# Patient Record
Sex: Female | Born: 1975 | Race: White | Hispanic: No | Marital: Married | Smoking: Never smoker
Health system: Southern US, Community
[De-identification: ages and names within clinical notes are randomized; demographics above are authoritative.]

## PROBLEM LIST (undated history)

## (undated) DIAGNOSIS — R3915 Urgency of urination: Secondary | ICD-10-CM

## (undated) DIAGNOSIS — R350 Frequency of micturition: Secondary | ICD-10-CM

## (undated) DIAGNOSIS — N3010 Interstitial cystitis (chronic) without hematuria: Secondary | ICD-10-CM

## (undated) HISTORY — PX: OTHER SURGICAL HISTORY: SHX169

## (undated) HISTORY — PX: APPENDECTOMY: SHX54

---

## 2009-11-27 HISTORY — PX: TOTAL ABDOMINAL HYSTERECTOMY W/ BILATERAL SALPINGOOPHORECTOMY: SHX83

## 2015-09-17 ENCOUNTER — Emergency Department (HOSPITAL_COMMUNITY)
Admission: EM | Admit: 2015-09-17 | Discharge: 2015-09-17 | Disposition: A | Discharge status: Home / Self Care | Payer: Medicaid Other | Attending: Emergency Medicine | Admitting: Emergency Medicine

## 2015-09-17 ENCOUNTER — Encounter (HOSPITAL_COMMUNITY): Payer: Self-pay | Admitting: Emergency Medicine

## 2015-09-17 ENCOUNTER — Emergency Department (HOSPITAL_COMMUNITY): Payer: Medicaid Other

## 2015-09-17 DIAGNOSIS — R1084 Generalized abdominal pain: Secondary | ICD-10-CM | POA: Diagnosis not present

## 2015-09-17 DIAGNOSIS — Z9089 Acquired absence of other organs: Secondary | ICD-10-CM | POA: Diagnosis not present

## 2015-09-17 DIAGNOSIS — R112 Nausea with vomiting, unspecified: Secondary | ICD-10-CM | POA: Insufficient documentation

## 2015-09-17 LAB — COMPREHENSIVE METABOLIC PANEL
ALBUMIN: 4.6 g/dL (ref 3.5–5.0)
ALK PHOS: 70 U/L (ref 38–126)
ALT: 21 U/L (ref 14–54)
ANION GAP: 8 (ref 5–15)
AST: 20 U/L (ref 15–41)
BUN: 11 mg/dL (ref 6–20)
CALCIUM: 9.5 mg/dL (ref 8.9–10.3)
CO2: 27 mmol/L (ref 22–32)
CREATININE: 0.68 mg/dL (ref 0.44–1.00)
Chloride: 104 mmol/L (ref 101–111)
GFR calc Af Amer: >60 mL/min (ref >60)
GFR calc non Af Amer: >60 mL/min (ref >60)
GLUCOSE: 98 mg/dL (ref 65–99)
Potassium: 4 mmol/L (ref 3.5–5.1)
SODIUM: 139 mmol/L (ref 135–145)
Total Bilirubin: 0.7 mg/dL (ref 0.3–1.2)
Total Protein: 8.3 g/dL — ABNORMAL HIGH (ref 6.5–8.1)

## 2015-09-17 LAB — URINALYSIS, ROUTINE W REFLEX MICROSCOPIC
BILIRUBIN URINE: NEGATIVE
Glucose, UA: NEGATIVE mg/dL
KETONES UR: NEGATIVE mg/dL
Leukocytes, UA: NEGATIVE
Nitrite: NEGATIVE
PH: 7 (ref 5.0–8.0)
Protein, ur: NEGATIVE mg/dL
Specific Gravity, Urine: 1.008 (ref 1.005–1.030)
Urobilinogen, UA: 0.2 mg/dL (ref 0.0–1.0)

## 2015-09-17 LAB — LIPASE, BLOOD: Lipase: 26 U/L (ref 11–51)

## 2015-09-17 LAB — CBC
HCT: 37.8 % (ref 36.0–46.0)
HEMOGLOBIN: 12.4 g/dL (ref 12.0–15.0)
MCH: 30.3 pg (ref 26.0–34.0)
MCHC: 32.8 g/dL (ref 30.0–36.0)
MCV: 92.4 fL (ref 78.0–100.0)
Platelets: 328 10*3/uL (ref 150–400)
RBC: 4.09 MIL/uL (ref 3.87–5.11)
RDW: 12.6 % (ref 11.5–15.5)
WBC: 6.7 10*3/uL (ref 4.0–10.5)

## 2015-09-17 LAB — URINE MICROSCOPIC-ADD ON

## 2015-09-17 MED ORDER — ONDANSETRON HCL 4 MG/2ML IJ SOLN
4.0000 mg | Freq: Once | INTRAMUSCULAR | Status: AC
  Administered 2015-09-17: 4 mg via INTRAVENOUS
  Filled 2015-09-17: qty 2

## 2015-09-17 MED ORDER — SODIUM CHLORIDE 0.9 % IV BOLUS (SEPSIS)
1000.0000 mL | Freq: Once | INTRAVENOUS | Status: AC
  Administered 2015-09-17: 1000 mL via INTRAVENOUS

## 2015-09-17 MED ORDER — PROMETHAZINE HCL 25 MG/ML IJ SOLN
25.0000 mg | Freq: Once | INTRAMUSCULAR | Status: AC
  Administered 2015-09-17: 25 mg via INTRAVENOUS
  Filled 2015-09-17: qty 1

## 2015-09-17 MED ORDER — DICYCLOMINE HCL 10 MG PO CAPS
10.0000 mg | ORAL_CAPSULE | Freq: Once | ORAL | Status: AC
  Administered 2015-09-17: 10 mg via ORAL
  Filled 2015-09-17: qty 1

## 2015-09-17 MED ORDER — ONDANSETRON 4 MG PO TBDP: ORAL_TABLET | ORAL | Status: AC

## 2015-09-17 MED ORDER — IOHEXOL 300 MG/ML  SOLN
100.0000 mL | Freq: Once | INTRAMUSCULAR | Status: AC | PRN
  Administered 2015-09-17: 100 mL via INTRAVENOUS

## 2015-09-17 MED ORDER — IOHEXOL 300 MG/ML  SOLN
25.0000 mL | INTRAMUSCULAR | Status: DC
  Administered 2015-09-17: 50 mL via ORAL

## 2015-09-17 MED ORDER — KETOROLAC TROMETHAMINE 30 MG/ML IJ SOLN
30.0000 mg | Freq: Once | INTRAMUSCULAR | Status: AC
  Administered 2015-09-17: 30 mg via INTRAVENOUS
  Filled 2015-09-17: qty 1

## 2015-09-17 MED ORDER — MORPHINE SULFATE (PF) 4 MG/ML IV SOLN
4.0000 mg | Freq: Once | INTRAVENOUS | Status: AC
  Administered 2015-09-17: 4 mg via INTRAVENOUS
  Filled 2015-09-17: qty 1

## 2015-09-17 MED ORDER — ONDANSETRON HCL 4 MG/2ML IJ SOLN
4.0000 mg | Freq: Once | INTRAMUSCULAR | Status: AC | PRN
  Administered 2015-09-17: 4 mg via INTRAVENOUS
  Filled 2015-09-17 (×2): qty 2

## 2015-09-17 NOTE — ED Provider Notes (Signed)
CSN: [id number]     Arrival date & time 09/17/15  4098 History   First MD Initiated Contact with Patient 09/17/15 0756     Chief Complaint  Patient presents with  . Abdominal Pain  . Emesis     (Consider location/radiation/quality/duration/timing/severity/associated sxs/prior Treatment) HPI Comments: 39 year old female complaining of gradually worsening abdominal pain, nausea and vomiting 4 days. Pain begins in her right upper quadrant radiating throughout her entire abdomen and as of 2 days ago towards her back. Pain is constant, described as sharp and "radiating" rated 7/10. No alleviating factors. Has no appetite and unable to tolerate any food or drink. Reports about 7 episodes of nonbloody, nonbilious emesis over the past 24 hours and a few episodes of intermittent, nonbloody diarrhea. She feels bloated. Denies any urinary symptoms. Reports chills without fever. Feels fatigued from vomiting and lack of oral intake. History of total abdominal hysterectomy and appendectomy.  Patient is a 39 y.o. female presenting with abdominal pain and vomiting. The history is provided by the patient.  Abdominal Pain Associated symptoms: chills, diarrhea, fatigue, nausea and vomiting   Emesis Associated symptoms: abdominal pain, chills and diarrhea     History reviewed. No pertinent past medical history. Past Surgical History  Procedure Laterality Date  . Abdominal hysterectomy    . Appendectomy     History reviewed. No pertinent family history. Social History  Substance Use Topics  . Smoking status: Never Smoker   . Smokeless tobacco: None  . Alcohol Use: No   OB History    No data available     Review of Systems  Constitutional: Positive for chills, appetite change and fatigue.  Gastrointestinal: Positive for nausea, vomiting, abdominal pain and diarrhea.  All other systems reviewed and are negative.     Allergies  Review of patient's allergies indicates no known  allergies.  Home Medications   Prior to Admission medications   Medication Sig Start Date End Date Taking? Authorizing Provider  bismuth subsalicylate (PEPTO BISMOL) 262 MG/15ML suspension Take 30 mLs by mouth every 6 (six) hours as needed for indigestion.   Yes Historical Provider, MD  Diphenhydramine-APAP, sleep, (EXCEDRIN PM PO) Take 1 tablet by mouth daily as needed.   Yes Historical Provider, MD  Phenyleph-Doxylamine-DM-APAP (NYQUIL SEVERE COLD/FLU) 5-6.25-10-325 MG/15ML LIQD Take 15 mLs by mouth 2 (two) times daily as needed (cough/congestion).   Yes Historical Provider, MD  ondansetron (ZOFRAN ODT) 4 MG disintegrating tablet  ODT q4 hours prn nausea/vomit 09/17/15   Kyrel Leighton M Kymere Fullington, PA-C   BP 102/63 mmHg  Pulse 67  Temp(Src) 97.8 F (36.6 C) (Oral)  Resp 18  Ht  (1.6 m)  Wt 165 lb (74.844 kg)  BMI 29.24 kg/m2  SpO2 100% Physical Exam  Constitutional: She is oriented to person, place, and time. She appears well-developed and well-nourished. No distress.  HENT:  Head: Normocephalic and atraumatic.  Mouth/Throat: Oropharynx is clear and moist.  Eyes: Conjunctivae and EOM are normal. Pupils are equal, round, and reactive to light. No scleral icterus.  Neck: Normal range of motion. Neck supple.  Cardiovascular: Normal rate, regular rhythm and normal heart sounds.   Pulmonary/Chest: Effort normal and breath sounds normal. No respiratory distress.  Abdominal: Soft. Normal appearance and bowel sounds are normal. She exhibits no distension. There is tenderness. There is positive Murphy's sign. There is no rigidity.  Generalized tenderness increased in RUQ/epigastrum to light touch with guarding.  Musculoskeletal: Normal range of motion. She exhibits no edema.  Neurological: She is  alert and oriented to person, place, and time. No sensory deficit.  Skin: Skin is warm and dry.  Psychiatric: She has a normal mood and affect. Her behavior is normal.  Nursing note and vitals  reviewed.   ED Course  Procedures (including critical care time) Labs Review Labs Reviewed  COMPREHENSIVE METABOLIC PANEL - Abnormal; Notable for the following:    Total Protein 8.3 (*)    All other components within normal limits  URINALYSIS, ROUTINE W REFLEX MICROSCOPIC (NOT AT [facility]) - Abnormal; Notable for the following:    Hgb urine dipstick TRACE (*)    All other components within normal limits  LIPASE, BLOOD  CBC  URINE MICROSCOPIC-ADD ON    Imaging Review [place] Abdomen Complete  09/17/2015  CLINICAL DATA:  Abdominal pain for 4 days. Right upper quadrant pain. EXAM: ULTRASOUND ABDOMEN COMPLETE COMPARISON:  None. FINDINGS: Gallbladder: No gallstones or wall thickening visualized. No sonographic Murphy sign noted. Common bile duct: Diameter: 2.2 mm Liver: Normal echogenicity without focal lesion or biliary dilatation. IVC: Normal caliber Pancreas: Sonographically unremarkable Spleen: Normal size and echogenicity without focal lesions Right Kidney: Length: 13.0 cm. Normal renal cortical thickness and echogenicity without focal lesions or hydronephrosis. Left Kidney: Length: 12.4 cm. Normal renal cortical thickness and echogenicity without focal lesions or hydronephrosis. Abdominal aorta: Normal caliber Other findings: None. IMPRESSION: Normal abdominal ultrasound examination. Electronically Signed   By: Rudie MeyerP.  Gallerani M.D.   On: 09/17/2015 09:35   Ct Abdomen Pelvis W Contrast  09/17/2015  CLINICAL DATA:  Nausea and vomiting 4 days with severe abdominal tenderness and inability to tolerate p.o. intake 3 days. Pain radiates to back. Previous appendectomy. EXAM: CT ABDOMEN AND PELVIS WITH CONTRAST TECHNIQUE: Multidetector CT imaging of the abdomen and pelvis was performed using the standard protocol following bolus administration of intravenous contrast. CONTRAST:  100mL OMNIPAQUE IOHEXOL 300 MG/ML  SOLN COMPARISON:  None. FINDINGS: Lung bases are within normal. Abdominal images demonstrate a  normal liver, spleen, pancreas, gallbladder and adrenal glands. Kidneys are within normal. Ureters are normal. Mesentery is within normal. There is no free fluid or focal inflammatory change. Small bowel and colon are within normal. Previous appendectomy. Abdominal aorta within normal. Pelvic images demonstrate the bladder and rectum to be within normal. Previous hysterectomy. Few pelvic phleboliths are present. Remaining bones and soft tissues are within normal. IMPRESSION: No acute findings in the abdomen/pelvis. Electronically Signed   By: Elberta Fortisaniel  Boyle M.D.   On: 09/17/2015 14:46   I have personally reviewed and evaluated these images and lab results as part of my medical decision-making.   EKG Interpretation None      MDM   Final diagnoses:  Non-intractable vomiting with nausea, vomiting of unspecified type  Generalized abdominal pain   Nontoxic appearing, NAD. AF VSS. Initial concern for possible cholecystitis versus gallstone. Ultrasound negative. Labs without acute finding. After lab results and ultrasound, patient still very uncomfortable, feels very bloated and still has generalized abdominal tenderness. Will obtain CT for further evaluation.  CT negative. Pt feeling more comfortable and tolerating PO. Still tender on repeat exam, improved from initial. Possible viral illness. No emergent finding warranting admission at this time and pt is stable for d/c. Resources given for f/u. Return precautions given. Pt/family/caregiver aware medical decision making process and agreeable with plan.  Kathrynn SpeedRobyn M Timara Loma, PA-C 09/17/15 1519  Lyndal Pulleyaniel Knott, MD 09/18/15 [phone]

## 2015-09-17 NOTE — Progress Notes (Addendum)
[place] and waiting on a letter" for her medicaid transfer.  Pt states "[place] two to two and a half years" Cm discussed [place] medicaid application needed, local DSS and [place] medicaid pt general responsibility and access to transportation to medical appointments  Below entered in d/c instructions Follow-up With Details Why Contact Info Please contact the local Dept of Social services to change your medicaid to [facility] via application Call As a Medicaid client you MUST contact them each time you change address, move to another county or another state to keep your address updated, As needed [employer] [employer]: [place]: [phone] (main) [email or web address] [address] [place], [place] [address] Medicaid Transportation assists you to your dr appointments: [phone] or [phone] Transportation Supervisor [phone]

## 2015-09-17 NOTE — ED Notes (Signed)
Pt stated that she has not been able to keep anything down in three days so she feels she would not be able to give a urine sample at this time. Pt stated she would try to go once the doc orders her fluids.

## 2015-09-17 NOTE — ED Notes (Signed)
Attempted IV x 3.  Unable to obtain.  Notifying PA regarding lab delay.  Pt in restroom now for urine sample.

## 2015-09-17 NOTE — ED Notes (Signed)
Patient c/o nausea and vomiting x4 days ago. Patient c/o severe abd tenderness, states she has been unable to tolerate any POs in 3 days. Patient states pain radiates into her back. Patient has used ibuprofen, excedrin, pepto, nyquil, and heating pad at home without relief. Emesis x 7 in past 24 hours.

## 2015-09-17 NOTE — Discharge Instructions (Signed)
Take Zofran as directed as needed for nausea. Follow up with one of the resources provided to establish care with a primary care doctor.  Abdominal Pain, Adult Many things can cause abdominal pain. Usually, abdominal pain is not caused by a disease and will improve without treatment. It can often be observed and treated at home. Your health care provider will do a physical exam and possibly order blood tests and X-rays to help determine the seriousness of your pain. However, in many cases, more time must pass before a clear cause of the pain can be found. Before that point, your health care provider may not know if you need more testing or further treatment. HOME CARE INSTRUCTIONS Monitor your abdominal pain for any changes. The following actions may help to alleviate any discomfort you are experiencing:  Only take over-the-counter or prescription medicines as directed by your health care provider.  Do not take laxatives unless directed to do so by your health care provider.  Try a clear liquid diet (broth, tea, or water) as directed by your health care provider. Slowly move to a bland diet as tolerated. SEEK MEDICAL CARE IF:  You have unexplained abdominal pain.  You have abdominal pain associated with nausea or diarrhea.  You have pain when you urinate or have a bowel movement.  You experience abdominal pain that wakes you in the night.  You have abdominal pain that is worsened or improved by eating food.  You have abdominal pain that is worsened with eating fatty foods.  You have a fever. SEEK IMMEDIATE MEDICAL CARE IF:  Your pain does not go away within 2 hours.  You keep throwing up (vomiting).  Your pain is felt only in portions of the abdomen, such as the right side or the left lower portion of the abdomen.  You pass bloody or black tarry stools. MAKE SURE YOU:  Understand these instructions.  Will watch your condition.  Will get help right away if you are not doing  well or get worse.   This information is not intended to replace advice given to you by your health care provider. Make sure you discuss any questions you have with your health care provider.   Document Released: 08/23/2005 Document Revised: 08/04/2015 Document Reviewed: 07/23/2013 Elsevier Interactive Patient Education 2016 Elsevier Inc.  Nausea and Vomiting Nausea is a sick feeling that often comes before throwing up (vomiting). Vomiting is a reflex where stomach contents come out of your mouth. Vomiting can cause severe loss of body fluids (dehydration). Children and elderly adults can become dehydrated quickly, especially if they also have diarrhea. Nausea and vomiting are symptoms of a condition or disease. It is important to find the cause of your symptoms. CAUSES   Direct irritation of the stomach lining. This irritation can result from increased acid production (gastroesophageal reflux disease), infection, food poisoning, taking certain medicines (such as nonsteroidal anti-inflammatory drugs), alcohol use, or tobacco use.  Signals from the brain.These signals could be caused by a headache, heat exposure, an inner ear disturbance, increased pressure in the brain from injury, infection, a tumor, or a concussion, pain, emotional stimulus, or metabolic problems.  An obstruction in the gastrointestinal tract (bowel obstruction).  Illnesses such as diabetes, hepatitis, gallbladder problems, appendicitis, kidney problems, cancer, sepsis, atypical symptoms of a heart attack, or eating disorders.  Medical treatments such as chemotherapy and radiation.  Receiving medicine that makes you sleep (general anesthetic) during surgery. DIAGNOSIS Your caregiver may ask for tests to be done  if the problems do not improve after a few days. Tests may also be done if symptoms are severe or if the reason for the nausea and vomiting is not clear. Tests may include:  Urine tests.  Blood tests.  Stool  tests.  Cultures (to look for evidence of infection).  X-rays or other imaging studies. Test results can help your caregiver make decisions about treatment or the need for additional tests. TREATMENT You need to stay well hydrated. Drink frequently but in small amounts.You may wish to drink water, sports drinks, clear broth, or eat frozen ice pops or gelatin dessert to help stay hydrated.When you eat, eating slowly may help prevent nausea.There are also some antinausea medicines that may help prevent nausea. HOME CARE INSTRUCTIONS   Take all medicine as directed by your caregiver.  If you do not have an appetite, do not force yourself to eat. However, you must continue to drink fluids.  If you have an appetite, eat a normal diet unless your caregiver tells you differently.  Eat a variety of complex carbohydrates (rice, wheat, potatoes, bread), lean meats, yogurt, fruits, and vegetables.  Avoid high-fat foods because they are more difficult to digest.  Drink enough water and fluids to keep your urine clear or pale yellow.  If you are dehydrated, ask your caregiver for specific rehydration instructions. Signs of dehydration may include:  Severe thirst.  Dry lips and mouth.  Dizziness.  Dark urine.  Decreasing urine frequency and amount.  Confusion.  Rapid breathing or pulse. SEEK IMMEDIATE MEDICAL CARE IF:   You have blood or brown flecks (like coffee grounds) in your vomit.  You have black or bloody stools.  You have a severe headache or stiff neck.  You are confused.  You have severe abdominal pain.  You have chest pain or trouble breathing.  You do not urinate at least once every 8 hours.  You develop cold or clammy skin.  You continue to vomit for longer than 24 to 48 hours.  You have a fever. MAKE SURE YOU:   Understand these instructions.  Will watch your condition.  Will get help right away if you are not doing well or get worse.   This  information is not intended to replace advice given to you by your health care provider. Make sure you discuss any questions you have with your health care provider.   Document Released: 11/13/2005 Document Revised: 02/05/2012 Document Reviewed: 04/12/2011 Elsevier Interactive Patient Education [employer]2016 Elsevier Inc.

## 2015-09-28 ENCOUNTER — Other Ambulatory Visit: Payer: Self-pay

## 2015-09-28 DIAGNOSIS — Z1231 Encounter for screening mammogram for malignant neoplasm of breast: Secondary | ICD-10-CM

## 2015-10-03 ENCOUNTER — Encounter (HOSPITAL_COMMUNITY): Payer: Self-pay | Admitting: Emergency Medicine

## 2015-10-03 ENCOUNTER — Emergency Department (HOSPITAL_COMMUNITY)
Admission: EM | Admit: 2015-10-03 | Discharge: 2015-10-03 | Disposition: A | Discharge status: Home / Self Care | Payer: Medicaid Other | Attending: Emergency Medicine | Admitting: Emergency Medicine

## 2015-10-03 ENCOUNTER — Emergency Department (HOSPITAL_COMMUNITY): Payer: Medicaid Other

## 2015-10-03 DIAGNOSIS — R079 Chest pain, unspecified: Secondary | ICD-10-CM | POA: Diagnosis present

## 2015-10-03 DIAGNOSIS — Z87448 Personal history of other diseases of urinary system: Secondary | ICD-10-CM | POA: Diagnosis not present

## 2015-10-03 DIAGNOSIS — R51 Headache: Secondary | ICD-10-CM | POA: Insufficient documentation

## 2015-10-03 DIAGNOSIS — R0789 Other chest pain: Secondary | ICD-10-CM | POA: Diagnosis not present

## 2015-10-03 DIAGNOSIS — R0602 Shortness of breath: Secondary | ICD-10-CM | POA: Diagnosis not present

## 2015-10-03 LAB — BASIC METABOLIC PANEL
Anion gap: 7 (ref 5–15)
BUN: 16 mg/dL (ref 6–20)
CHLORIDE: 106 mmol/L (ref 101–111)
CO2: 25 mmol/L (ref 22–32)
Calcium: 9.5 mg/dL (ref 8.9–10.3)
Creatinine, Ser: 0.7 mg/dL (ref 0.44–1.00)
Glucose, Bld: 127 mg/dL — ABNORMAL HIGH (ref 65–99)
POTASSIUM: 3.5 mmol/L (ref 3.5–5.1)
SODIUM: 138 mmol/L (ref 135–145)

## 2015-10-03 LAB — I-STAT TROPONIN, ED
TROPONIN I, POC: 0 ng/mL (ref 0.00–0.08)
TROPONIN I, POC: 0 ng/mL (ref 0.00–0.08)

## 2015-10-03 LAB — CBC
HEMATOCRIT: 36.8 % (ref 36.0–46.0)
Hemoglobin: 12.1 g/dL (ref 12.0–15.0)
MCH: 30.3 pg (ref 26.0–34.0)
MCHC: 32.9 g/dL (ref 30.0–36.0)
MCV: 92 fL (ref 78.0–100.0)
PLATELETS: 291 10*3/uL (ref 150–400)
RBC: 4 MIL/uL (ref 3.87–5.11)
RDW: 12.7 % (ref 11.5–15.5)
WBC: 8.9 10*3/uL (ref 4.0–10.5)

## 2015-10-03 MED ORDER — PROMETHAZINE HCL 25 MG PO TABS: 25.0000 mg | ORAL_TABLET | Freq: Four times a day (QID) | ORAL | Status: DC | PRN

## 2015-10-03 MED ORDER — PROMETHAZINE HCL 25 MG PO TABS
25.0000 mg | ORAL_TABLET | ORAL | Status: AC
  Administered 2015-10-03: 25 mg via ORAL
  Filled 2015-10-03: qty 1

## 2015-10-03 MED ORDER — OXYCODONE-ACETAMINOPHEN 5-325 MG PO TABS
1.0000 | ORAL_TABLET | Freq: Once | ORAL | Status: AC
Start: 2015-10-03 — End: 2015-10-03
  Administered 2015-10-03: 1 via ORAL
  Filled 2015-10-03: qty 1

## 2015-10-03 MED ORDER — ONDANSETRON 4 MG PO TBDP
4.0000 mg | ORAL_TABLET | Freq: Once | ORAL | Status: AC
  Administered 2015-10-03: 4 mg via ORAL
  Filled 2015-10-03: qty 1

## 2015-10-03 NOTE — ED Provider Notes (Signed)
CSN: [id number]     Arrival date & time 10/03/15  1610 History   First MD Initiated Contact with Patient 10/03/15 1702     Chief Complaint  Patient presents with  . Chest Pain     (Consider location/radiation/quality/duration/timing/severity/associated sxs/prior Treatment) HPI Stephanie Erickson is a 39 y.o. female who comes in for evaluation of chest pain. Patient reports approximately 45 minutes ago she was sitting on the bed watching TV when she experienced sudden onset of central to left-sided chest pressure that would "tight enough and relax and then tightened and relax". She reports shortly after that she began to have "an intense headache and felt like it was going to blow up". She reports associated intermittent shortness of breath and then 1 episode of nonbloody, nonbilious emesis. No radiation, no diaphoresis. She denies any recent travel or surgeries, unilateral leg swelling, hemoptysis, OCP use, history of blood clot. She is a nonsmoker with no other risk factors. Nothing seems to make this problem better or worse. She reports she has never experienced this discomfort before while walking or climbing stairs.  Past Medical History  Diagnosis Date  . Cystitis    Past Surgical History  Procedure Laterality Date  . Abdominal hysterectomy    . Appendectomy     History reviewed. No pertinent family history. Social History  Substance Use Topics  . Smoking status: Never Smoker   . Smokeless tobacco: None  . Alcohol Use: No   OB History    No data available     Review of Systems A 10 point review of systems was completed and was negative except for pertinent positives and negatives as mentioned in the history of present illness     Allergies  Review of patient's allergies indicates no known allergies.  Home Medications   Prior to Admission medications   Medication Sig Start Date End Date Taking? Authorizing Provider  acetaminophen-codeine (TYLENOL #3) 300-30 MG tablet Take  1 tablet by mouth every 6 (six) hours as needed for moderate pain.   Yes Historical Provider, MD  ondansetron (ZOFRAN ODT) 4 MG disintegrating tablet  ODT q4 hours prn nausea/vomit Patient not taking: Reported on 10/03/2015 09/17/15   Nada Boozer Hess, PA-C  promethazine (PHENERGAN) 25 MG tablet Take 1 tablet (25 mg total) by mouth every 6 (six) hours as needed for nausea or vomiting. 10/03/15   Kisa Fujii, PA-C   BP 109/64 mmHg  Pulse 74  Temp(Src) 98.1 F (36.7 C) (Oral)  Resp 18  SpO2 99% Physical Exam  Constitutional: She is oriented to person, place, and time. She appears well-developed and well-nourished.  HENT:  Head: Normocephalic and atraumatic.  Mouth/Throat: Oropharynx is clear and moist.  Eyes: Conjunctivae are normal. Pupils are equal, round, and reactive to light. Right eye exhibits no discharge. Left eye exhibits no discharge. No scleral icterus.  Neck: Normal range of motion. Neck supple.  Cardiovascular: Normal rate, regular rhythm and normal heart sounds.   Pulmonary/Chest: Effort normal and breath sounds normal. No respiratory distress. She has no wheezes. She has no rales.  Abdominal: Soft. There is no tenderness.  Musculoskeletal: Normal range of motion. She exhibits no edema or tenderness.  Neurological: She is alert and oriented to person, place, and time.  Cranial Nerves II-XII grossly intact  Skin: Skin is warm and dry. No rash noted. She is not diaphoretic.  Psychiatric: She has a normal mood and affect.  Nursing note and vitals reviewed.   ED Course  Procedures (including critical  care time) Labs Review Labs Reviewed  BASIC METABOLIC PANEL - Abnormal; Notable for the following:    Glucose, Bld 127 (*)    All other components within normal limits  CBC  I-STAT TROPOININ, ED  Rosezena SensorI-STAT TROPOININ, ED    Imaging Review Dg Chest 2 View  10/03/2015  CLINICAL DATA:  Patient with left chest tightness while at rest. Nausea. Diaphoresis. EXAM: CHEST  2 VIEW  COMPARISON:  None. FINDINGS: Monitoring leads overlie the patient. Normal cardiac and mediastinal contours. No consolidative pulmonary opacities. No pleural effusion or pneumothorax. Regional skeleton is unremarkable. IMPRESSION: No acute cardiopulmonary process. Electronically Signed   By: Annia Beltrew  Davis M.D.   On: 10/03/2015 17:06   I have personally reviewed and evaluated these images and lab results as part of my medical decision-making.   EKG Interpretation   Date/Time:  Sunday October 03 2015 16:17:03 EST Ventricular Rate:  81 PR Interval:  147 QRS Duration: 79 QT Interval:  367 QTC Calculation: 426 R Axis:   73 Text Interpretation:  Sinus rhythm RSR' in V1 or V2, right VCD or RVH ED  PHYSICIAN INTERPRETATION AVAILABLE IN [facility] Confirmed by TEST,  Record ([id number]) on 10/04/2015 6:47:36 AM     Meds given in ED:  Medications  ondansetron (ZOFRAN-ODT) disintegrating tablet 4 mg (4 mg Oral Given 10/03/15 1840)  oxyCODONE-acetaminophen (PERCOCET/ROXICET) 5-325 MG per tablet 1 tablet (1 tablet Oral Given 10/03/15 1841)  promethazine (PHENERGAN) tablet 25 mg (25 mg Oral Given 10/03/15 1945)    Discharge Medication List as of 10/03/2015 10:14 PM    START taking these medications   Details  promethazine (PHENERGAN) 25 MG tablet Take 1 tablet (25 mg total) by mouth every 6 (six) hours as needed for nausea or vomiting., Starting 10/03/2015, Until Discontinued, Print       Filed Vitals:   10/03/15 1618 10/03/15 1916 10/03/15 2057 10/03/15 2219  BP: 120/72 106/59 98/58 109/64  Pulse: 83 78 75 74  Temp: 97.5 F (36.4 C) 97.8 F (36.6 C)  98.1 F (36.7 C)  TempSrc: Oral Oral  Oral  Resp: 30 14 22 18   SpO2: 100% 100% 99% 99%    MDM  Vitals stable - WNL -afebrile Pt resting comfortably in ED. chest discomfort have resolved in the ED with administration of Vicodin. PE--Lung exam benign. Cardiac auscultation reveals no murmurs rubs or gallops. Grossly Benign Physical  Exam Labwork:DeltaTroponin negative. EKG reassuring. Labs otherwise noncontributory Imaging: CXR shows no acute cardiopulmonary pathology  DDX: Patient with atypical chest pain with unknown etiology, but is relieved with narcotic pain medicines. Clinical picture and exam today not consistent with ACS/dissection. Heart score 0. No evidence of spontaneous pneumothorax, esophageal rupture or other mediastinitis. PERC negative. No evidence of myocarditis, endocarditis, pericarditis.  Will DC with antinausea medicine at patient's request. She reports that she'll be able to follow up with her PCP this Friday for her regularly scheduled appointment. I discussed all relevant lab findings and imaging results with pt and they verbalized understanding. Discussed f/u with PCP within 48 hrs and return precautions, pt very amenable to plan.   Final diagnoses:  Atypical chest pain        Joycie PeekBenjamin Janayia Burggraf, PA-C 10/04/15 1626  Alvira MondayErin Schlossman, MD 10/05/15 1724

## 2015-10-03 NOTE — Discharge Instructions (Signed)
You were evaluated in the ED today for your chest discomfort and there does not appear to be an emergent cause for your symptoms at this time. Your exam, labs, EKG and x-rays are all reassuring. Please follow-up with your doctor for further evaluation and in one week at her regularly scheduled appointment. Return to ED for new or worsening symptoms.  Nonspecific Chest Pain  Chest pain can be caused by many different conditions. There is always a chance that your pain could be related to something serious, such as a heart attack or a blood clot in your lungs. Chest pain can also be caused by conditions that are not life-threatening. If you have chest pain, it is very important to follow up with your health care provider. CAUSES  Chest pain can be caused by:  Heartburn.  Pneumonia or bronchitis.  Anxiety or stress.  Inflammation around your heart (pericarditis) or lung (pleuritis or pleurisy).  A blood clot in your lung.  A collapsed lung (pneumothorax). It can develop suddenly on its own (spontaneous pneumothorax) or from trauma to the chest.  Shingles infection (varicella-zoster virus).  Heart attack.  Damage to the bones, muscles, and cartilage that make up your chest wall. This can include:  Bruised bones due to injury.  Strained muscles or cartilage due to frequent or repeated coughing or overwork.  Fracture to one or more ribs.  Sore cartilage due to inflammation (costochondritis). RISK FACTORS  Risk factors for chest pain may include:  Activities that increase your risk for trauma or injury to your chest.  Respiratory infections or conditions that cause frequent coughing.  Medical conditions or overeating that can cause heartburn.  Heart disease or family history of heart disease.  Conditions or health behaviors that increase your risk of developing a blood clot.  Having had chicken pox (varicella zoster). SIGNS AND SYMPTOMS Chest pain can feel like:  Burning or  tingling on the surface of your chest or deep in your chest.  Crushing, pressure, aching, or squeezing pain.  Dull or sharp pain that is worse when you move, cough, or take a deep breath.  Pain that is also felt in your back, neck, shoulder, or arm, or pain that spreads to any of these areas. Your chest pain may come and go, or it may stay constant. DIAGNOSIS Lab tests or other studies may be needed to find the cause of your pain. Your health care provider may have you take a test called an ambulatory ECG (electrocardiogram). An ECG records your heartbeat patterns at the time the test is performed. You may also have other tests, such as:  Transthoracic echocardiogram (TTE). During echocardiography, sound waves are used to create a picture of all of the heart structures and to look at how blood flows through your heart.  Transesophageal echocardiogram (TEE).This is a more advanced imaging test that obtains images from inside your body. It allows your health care provider to see your heart in finer detail.  Cardiac monitoring. This allows your health care provider to monitor your heart rate and rhythm in real time.  Holter monitor. This is a portable device that records your heartbeat and can help to diagnose abnormal heartbeats. It allows your health care provider to track your heart activity for several days, if needed.  Stress tests. These can be done through exercise or by taking medicine that makes your heart beat more quickly.  Blood tests.  Imaging tests. TREATMENT  Your treatment depends on what is causing your chest  pain. Treatment may include:  Medicines. These may include:  Acid blockers for heartburn.  Anti-inflammatory medicine.  Pain medicine for inflammatory conditions.  Antibiotic medicine, if an infection is present.  Medicines to dissolve blood clots.  Medicines to treat coronary artery disease.  Supportive care for conditions that do not require medicines.  This may include:  Resting.  Applying heat or cold packs to injured areas.  Limiting activities until pain decreases. HOME CARE INSTRUCTIONS  If you were prescribed an antibiotic medicine, finish it all even if you start to feel better.  Avoid any activities that bring on chest pain.  Do not use any tobacco products, including cigarettes, chewing tobacco, or electronic cigarettes. If you need help quitting, ask your health care provider.  Do not drink alcohol.  Take medicines only as directed by your health care provider.  Keep all follow-up visits as directed by your health care provider. This is important. This includes any further testing if your chest pain does not go away.  If heartburn is the cause for your chest pain, you may be told to keep your head raised (elevated) while sleeping. This reduces the chance that acid will go from your stomach into your esophagus.  Make lifestyle changes as directed by your health care provider. These may include:  Getting regular exercise. Ask your health care provider to suggest some activities that are safe for you.  Eating a heart-healthy diet. A registered dietitian can help you to learn healthy eating options.  Maintaining a healthy weight.  Managing diabetes, if necessary.  Reducing stress. SEEK MEDICAL CARE IF:  Your chest pain does not go away after treatment.  You have a rash with blisters on your chest.  You have a fever. SEEK IMMEDIATE MEDICAL CARE IF:   Your chest pain is worse.  You have an increasing cough, or you cough up blood.  You have severe abdominal pain.  You have severe weakness.  You faint.  You have chills.  You have sudden, unexplained chest discomfort.  You have sudden, unexplained discomfort in your arms, back, neck, or jaw.  You have shortness of breath at any time.  You suddenly start to sweat, or your skin gets clammy.  You feel nauseous or you vomit.  You suddenly feel  light-headed or dizzy.  Your heart begins to beat quickly, or it feels like it is skipping beats. These symptoms may represent a serious problem that is an emergency. Do not wait to see if the symptoms will go away. Get medical help right away. Call your local emergency services (911 in the [place] Do not drive yourself to the hospital.   This information is not intended to replace advice given to you by your health care provider. Make sure you discuss any questions you have with your health care provider.   Document Released: 08/23/2005 Document Revised: 12/04/2014 Document Reviewed: 06/19/2014 Elsevier Interactive Patient Education [employer].

## 2015-10-03 NOTE — ED Notes (Signed)
Pt stated she vomited. Pt has vomited estimated 2-3 tablespoonfuls of emesis with no pill fragments.

## 2015-10-03 NOTE — ED Notes (Signed)
Pt reported lt side chest tightness while at rest with nausea, nonradiating, diaphoresis, lightheadedness and SHOB.

## 2015-10-03 NOTE — ED Notes (Addendum)
Patient requesting additional nausea medication. Notified Ben PA of patients request.

## 2015-10-14 ENCOUNTER — Ambulatory Visit
Admission: RE | Admit: 2015-10-14 | Discharge: 2015-10-14 | Disposition: A | Discharge status: Home / Self Care | Payer: Medicaid Other | Source: Ambulatory Visit

## 2015-10-14 DIAGNOSIS — Z1231 Encounter for screening mammogram for malignant neoplasm of breast: Secondary | ICD-10-CM

## 2015-10-26 ENCOUNTER — Encounter (HOSPITAL_BASED_OUTPATIENT_CLINIC_OR_DEPARTMENT_OTHER): Payer: Self-pay | Admitting: *Deleted

## 2015-10-26 ENCOUNTER — Other Ambulatory Visit: Payer: Self-pay | Admitting: Urology

## 2015-10-26 NOTE — Progress Notes (Signed)
NPO AFTER MN.  ARRIVE AT [id number].  NEEDS HG.  

## 2015-10-29 ENCOUNTER — Encounter (HOSPITAL_BASED_OUTPATIENT_CLINIC_OR_DEPARTMENT_OTHER)
Admission: RE | Disposition: A | Discharge status: Home / Self Care | Payer: Self-pay | Source: Ambulatory Visit | Attending: Urology

## 2015-10-29 ENCOUNTER — Encounter (HOSPITAL_BASED_OUTPATIENT_CLINIC_OR_DEPARTMENT_OTHER): Payer: Self-pay | Admitting: *Deleted

## 2015-10-29 ENCOUNTER — Ambulatory Visit (HOSPITAL_BASED_OUTPATIENT_CLINIC_OR_DEPARTMENT_OTHER): Payer: Medicaid Other | Admitting: Anesthesiology

## 2015-10-29 ENCOUNTER — Ambulatory Visit (HOSPITAL_BASED_OUTPATIENT_CLINIC_OR_DEPARTMENT_OTHER)
Admission: RE | Admit: 2015-10-29 | Discharge: 2015-10-29 | Disposition: A | Discharge status: Home / Self Care | Payer: Medicaid Other | Source: Ambulatory Visit | Attending: Urology | Admitting: Urology

## 2015-10-29 DIAGNOSIS — Z79899 Other long term (current) drug therapy: Secondary | ICD-10-CM | POA: Insufficient documentation

## 2015-10-29 DIAGNOSIS — N3281 Overactive bladder: Secondary | ICD-10-CM

## 2015-10-29 DIAGNOSIS — K5909 Other constipation: Secondary | ICD-10-CM | POA: Insufficient documentation

## 2015-10-29 DIAGNOSIS — Z791 Long term (current) use of non-steroidal anti-inflammatories (NSAID): Secondary | ICD-10-CM | POA: Insufficient documentation

## 2015-10-29 DIAGNOSIS — N3010 Interstitial cystitis (chronic) without hematuria: Secondary | ICD-10-CM | POA: Diagnosis not present

## 2015-10-29 HISTORY — DX: Frequency of micturition: R35.0

## 2015-10-29 HISTORY — DX: Interstitial cystitis (chronic) without hematuria: N30.10

## 2015-10-29 HISTORY — DX: Urgency of urination: R39.15

## 2015-10-29 HISTORY — PX: CYSTOSCOPY: SHX5120

## 2015-10-29 LAB — POCT HEMOGLOBIN-HEMACUE: HEMOGLOBIN: 12.7 g/dL (ref 12.0–15.0)

## 2015-10-29 SURGERY — CYSTOSCOPY
Anesthesia: General | Site: Bladder

## 2015-10-29 MED ORDER — ONDANSETRON HCL 4 MG/2ML IJ SOLN
INTRAMUSCULAR | Status: AC
  Filled 2015-10-29: qty 2

## 2015-10-29 MED ORDER — BELLADONNA ALKALOIDS-OPIUM 16.2-60 MG RE SUPP
RECTAL | Status: AC
  Filled 2015-10-29: qty 1

## 2015-10-29 MED ORDER — PROMETHAZINE HCL 25 MG/ML IJ SOLN
INTRAMUSCULAR | Status: AC
  Filled 2015-10-29: qty 1

## 2015-10-29 MED ORDER — URELLE 81 MG PO TABS
ORAL_TABLET | ORAL | Status: AC
  Filled 2015-10-29: qty 1

## 2015-10-29 MED ORDER — KETOROLAC TROMETHAMINE 30 MG/ML IJ SOLN
INTRAMUSCULAR | Status: AC
  Filled 2015-10-29: qty 1

## 2015-10-29 MED ORDER — LIDOCAINE HCL (CARDIAC) 20 MG/ML IV SOLN
INTRAVENOUS | Status: DC | PRN
  Administered 2015-10-29: 60 mg via INTRAVENOUS

## 2015-10-29 MED ORDER — URELLE 81 MG PO TABS: 1.0000 | ORAL_TABLET | Freq: Three times a day (TID) | ORAL | Status: AC

## 2015-10-29 MED ORDER — CIPROFLOXACIN IN D5W 400 MG/200ML IV SOLN
400.0000 mg | INTRAVENOUS | Status: AC
  Administered 2015-10-29: 400 mg via INTRAVENOUS
  Filled 2015-10-29: qty 200

## 2015-10-29 MED ORDER — FENTANYL CITRATE (PF) 100 MCG/2ML IJ SOLN
INTRAMUSCULAR | Status: AC
  Filled 2015-10-29: qty 4

## 2015-10-29 MED ORDER — LACTATED RINGERS IV SOLN
INTRAVENOUS | Status: DC
  Administered 2015-10-29: 09:00:00 via INTRAVENOUS
  Filled 2015-10-29: qty 1000

## 2015-10-29 MED ORDER — PROPOFOL 10 MG/ML IV BOLUS
INTRAVENOUS | Status: AC
  Filled 2015-10-29: qty 40

## 2015-10-29 MED ORDER — SODIUM CHLORIDE 0.9 % IJ SOLN
INTRAMUSCULAR | Status: DC | PRN
  Administered 2015-10-29: 10 mL

## 2015-10-29 MED ORDER — ONDANSETRON HCL 4 MG/2ML IJ SOLN
INTRAMUSCULAR | Status: DC | PRN
  Administered 2015-10-29: 4 mg via INTRAVENOUS

## 2015-10-29 MED ORDER — STERILE WATER FOR IRRIGATION IR SOLN
Status: DC | PRN
  Administered 2015-10-29: 3000 mL

## 2015-10-29 MED ORDER — FENTANYL CITRATE (PF) 100 MCG/2ML IJ SOLN
INTRAMUSCULAR | Status: AC
  Filled 2015-10-29: qty 2

## 2015-10-29 MED ORDER — ONABOTULINUMTOXINA 100 UNITS IJ SOLR
INTRAMUSCULAR | Status: DC | PRN
  Administered 2015-10-29: 100 [IU] via INTRAMUSCULAR

## 2015-10-29 MED ORDER — TRAMADOL-ACETAMINOPHEN 37.5-325 MG PO TABS: 1.0000 | ORAL_TABLET | Freq: Four times a day (QID) | ORAL | Status: AC | PRN

## 2015-10-29 MED ORDER — ONABOTULINUMTOXINA 100 UNITS IJ SOLR
INTRAMUSCULAR | Status: AC
  Filled 2015-10-29: qty 200

## 2015-10-29 MED ORDER — DEXAMETHASONE SODIUM PHOSPHATE 10 MG/ML IJ SOLN
INTRAMUSCULAR | Status: AC
  Filled 2015-10-29: qty 1

## 2015-10-29 MED ORDER — TRAMADOL HCL 50 MG PO TABS
ORAL_TABLET | ORAL | Status: AC
  Filled 2015-10-29: qty 1

## 2015-10-29 MED ORDER — FENTANYL CITRATE (PF) 100 MCG/2ML IJ SOLN
INTRAMUSCULAR | Status: DC | PRN
  Administered 2015-10-29 (×2): 50 ug via INTRAVENOUS

## 2015-10-29 MED ORDER — PROPOFOL 10 MG/ML IV BOLUS
INTRAVENOUS | Status: DC | PRN
  Administered 2015-10-29: 200 mg via INTRAVENOUS
  Administered 2015-10-29: 50 mg via INTRAVENOUS

## 2015-10-29 MED ORDER — LACTATED RINGERS IV SOLN
INTRAVENOUS | Status: DC
  Filled 2015-10-29: qty 1000

## 2015-10-29 MED ORDER — PROMETHAZINE HCL 25 MG/ML IJ SOLN
6.2500 mg | Freq: Once | INTRAMUSCULAR | Status: DC
  Filled 2015-10-29: qty 1

## 2015-10-29 MED ORDER — GLYCOPYRROLATE 0.2 MG/ML IJ SOLN
INTRAMUSCULAR | Status: AC
  Filled 2015-10-29: qty 1

## 2015-10-29 MED ORDER — FENTANYL CITRATE (PF) 100 MCG/2ML IJ SOLN
25.0000 ug | INTRAMUSCULAR | Status: DC | PRN
  Administered 2015-10-29: 50 ug via INTRAVENOUS
  Filled 2015-10-29: qty 1

## 2015-10-29 MED ORDER — ACETAMINOPHEN 10 MG/ML IV SOLN
INTRAVENOUS | Status: DC | PRN
  Administered 2015-10-29: 1000 mg via INTRAVENOUS

## 2015-10-29 MED ORDER — URELLE 81 MG PO TABS
1.0000 | ORAL_TABLET | Freq: Four times a day (QID) | ORAL | Status: DC
  Administered 2015-10-29: 81 mg via ORAL
  Filled 2015-10-29: qty 1

## 2015-10-29 MED ORDER — TRIMETHOPRIM 100 MG PO TABS: 100.0000 mg | ORAL_TABLET | Freq: Every day | ORAL | Status: DC

## 2015-10-29 MED ORDER — KETOROLAC TROMETHAMINE 30 MG/ML IJ SOLN
INTRAMUSCULAR | Status: DC | PRN
  Administered 2015-10-29: 30 mg via INTRAVENOUS

## 2015-10-29 MED ORDER — MIDAZOLAM HCL 5 MG/5ML IJ SOLN
INTRAMUSCULAR | Status: DC | PRN
  Administered 2015-10-29: 2 mg via INTRAVENOUS

## 2015-10-29 MED ORDER — MIDAZOLAM HCL 2 MG/2ML IJ SOLN
INTRAMUSCULAR | Status: AC
  Filled 2015-10-29: qty 2

## 2015-10-29 MED ORDER — DEXAMETHASONE SODIUM PHOSPHATE 4 MG/ML IJ SOLN
INTRAMUSCULAR | Status: DC | PRN
  Administered 2015-10-29: 10 mg via INTRAVENOUS

## 2015-10-29 MED ORDER — TRAMADOL HCL 50 MG PO TABS
50.0000 mg | ORAL_TABLET | Freq: Once | ORAL | Status: AC
Start: 2015-10-29 — End: 2015-10-29
  Administered 2015-10-29: 50 mg via ORAL
  Filled 2015-10-29: qty 1

## 2015-10-29 MED ORDER — CIPROFLOXACIN IN D5W 400 MG/200ML IV SOLN
INTRAVENOUS | Status: AC
  Filled 2015-10-29: qty 200

## 2015-10-29 SURGICAL SUPPLY — 21 items
BAG DRAIN URO-CYSTO SKYTR STRL (DRAIN) ×2 IMPLANT
BOOTIES KNEE HIGH SLOAN (MISCELLANEOUS) ×2 IMPLANT
CLOTH BEACON ORANGE TIMEOUT ST (SAFETY) ×2 IMPLANT
GLOVE BIO SURGEON STRL SZ 6.5 (GLOVE) ×2 IMPLANT
GLOVE BIO SURGEON STRL SZ7.5 (GLOVE) ×2 IMPLANT
GLOVE INDICATOR 6.5 STRL GRN (GLOVE) ×2 IMPLANT
GOWN STRL REUS W/ TWL LRG LVL3 (GOWN DISPOSABLE) ×1 IMPLANT
GOWN STRL REUS W/ TWL XL LVL3 (GOWN DISPOSABLE) ×1 IMPLANT
GOWN STRL REUS W/TWL LRG LVL3 (GOWN DISPOSABLE) ×1
GOWN STRL REUS W/TWL XL LVL3 (GOWN DISPOSABLE) ×1
KIT ROOM TURNOVER WOR (KITS) ×2 IMPLANT
MANIFOLD NEPTUNE II (INSTRUMENTS) IMPLANT
NDL SAFETY ECLIPSE 18X1.5 (NEEDLE) ×1 IMPLANT
NEEDLE HYPO 18GX1.5 SHARP (NEEDLE) ×1
NEEDLE HYPO 22GX1.5 SAFETY (NEEDLE) ×2 IMPLANT
NEEDLE SPNL 22GX7 QUINCKE BK (NEEDLE) IMPLANT
NS IRRIG 500ML POUR BTL (IV SOLUTION) IMPLANT
PACK CYSTO (CUSTOM PROCEDURE TRAY) ×2 IMPLANT
SYR 20CC LL (SYRINGE) ×2 IMPLANT
TUBE CONNECTING 12X1/4 (SUCTIONS) IMPLANT
WATER STERILE IRR 3000ML UROMA (IV SOLUTION) ×2 IMPLANT

## 2015-10-29 NOTE — Op Note (Signed)
Pre-operative diagnosis :   Overactive bladder with interstitial cystitis  Postoperative diagnosis:  Same Operation:  Cystourethroscopy, hydrodistention of bladder (650 mL) injection of submucosal Botox (100 units)  Surgeon:  S. Patsi Searsannenbaum, MD  First assistant: None   Anesthesia:  General LMA  Preparation:  After appropriate preanesthesia, the patient was brought the operating, placed me operating table in the dorsal supine position where general LMA anesthesia was introduced. She was then replaced in the dorsal lithotomy position with the pubis was prepped with Betadine solution and draped in usual fashion. The history was reviewed. The patient was interviewed. She was unsure how much Botox a previously been injected, and we have selected a 100 units for injection today. This is the standard amount for overactive bladder patients.  Review history:  History of Present Illness 39 yo female, P 1-1-0, Medicaid, student at [employer], studying Criime scene clean-up for certification, referred by Dr.Sun for IC evaluation. In 2010, diagnosis of cervical cancer, treated hysterectomy ( [place], while married ). She then began to have pelvic pain, with diagnosis of IC, Failed Rx Neurontin, Amitriptyline, Elmiron, etc. without help. Blsdder irrigations helped somewhat, but only for 24 hrs. She divorced while still in [place], and moved to [place], and saw Urologist, with urodynamics and cystoscopy with dx IC, and Rx Botox ( unknown amount). [place], with repeat Botox in June, 2016, with success. It has now worn off, and she wants to have Botox injection again.   She has lower abdominal pain. She was previously seeing Dr. Shirlean Mylarharles Bowie (Urologist) and getting Botox. Botox has helped nocturia, and also decreased the pelvic pain and vaginal pain. She has chronic constipation and is taking Tylenol 3 per Dr. Wynelle LinkSun.    PUFF score - 29.   Statement of  Likelihood of Success: Excellent. TIME-OUT  observed.:  Procedure: Cystourethritis, was, the patient is hydrodistended to 6 and 50 mL. Botox, 100 units, is reconstituted in a 10 mL syringe. 1 mL injections are then accomplished, proximal to the trigone. Excellent submucosal distribution of the injections is accomplished, with demonstrable submucosal rise of the epithelium. An next or 1 mL of saline is injected through the needle, after the last injection, to be sure that we have injected all of the Botox. The needle was withdrawn. There is no bleeding. The bladder drained of fluid. The patient received IV Tylenol and IV Toradol during the procedure. She was awakened, and taken to recovery room in good condition.

## 2015-10-29 NOTE — Anesthesia Procedure Notes (Signed)
Procedure Name: LMA Insertion Date/Time: 10/29/2015 10:10 AM Performed by: Tyrone NineSAUVE, Jessica Seidman F Pre-anesthesia Checklist: Patient identified, Timeout performed, Emergency Drugs available, Suction available and Patient being monitored Patient Re-evaluated:Patient Re-evaluated prior to inductionOxygen Delivery Method: Circle system utilized Preoxygenation: Pre-oxygenation with 100% oxygen Intubation Type: IV induction Ventilation: Mask ventilation without difficulty LMA: LMA inserted LMA Size: 4.0 Number of attempts: 1 Placement Confirmation: positive ETCO2 and breath sounds checked- equal and bilateral Tube secured with: Tape Dental Injury: Teeth and Oropharynx as per pre-operative assessment

## 2015-10-29 NOTE — Anesthesia Preprocedure Evaluation (Signed)
Anesthesia Evaluation  Patient identified by MRN, date of birth, ID band Patient awake    Reviewed: Allergy & Precautions, H&P , NPO status , Patient's Chart, lab work & pertinent test results  Airway Mallampati: II  TM Distance: >3 FB Neck ROM: full    Dental no notable dental hx. (+) Dental Advisory Given, Teeth Intact   Pulmonary neg pulmonary ROS,    Pulmonary exam normal breath sounds clear to auscultation       Cardiovascular Exercise Tolerance: Good negative cardio ROS Normal cardiovascular exam Rhythm:regular Rate:Normal     Neuro/Psych negative neurological ROS  negative psych ROS   GI/Hepatic negative GI ROS, Neg liver ROS,   Endo/Other  negative endocrine ROS  Renal/GU IC  negative genitourinary   Musculoskeletal   Abdominal   Peds  Hematology negative hematology ROS (+)   Anesthesia Other Findings   Reproductive/Obstetrics negative OB ROS                             Anesthesia Physical Anesthesia Plan  ASA: II  Anesthesia Plan: General   Post-op Pain Management:    Induction: Intravenous  Airway Management Planned: LMA  Additional Equipment:   Intra-op Plan:   Post-operative Plan:   Informed Consent: I have reviewed the patients History and Physical, chart, labs and discussed the procedure including the risks, benefits and alternatives for the proposed anesthesia with the patient or authorized representative who has indicated his/her understanding and acceptance.   Dental Advisory Given  Plan Discussed with: CRNA and Surgeon  Anesthesia Plan Comments:         Anesthesia Quick Evaluation

## 2015-10-29 NOTE — H&P (Signed)
Reason For Visit IC evaluation   Active Problems Problems  1. 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 In 2010, diagnosis of cervical cancer, treated hysterectomy ( [place], while married ). She then began to have pelvic pain, with diagnosis of IC, Failed Rx Neurontin, Amitriptyline, Elmiron, etc. without help. Blsdder irrigations helped somewhat, but only for 24 hrs. She divorced while still in [place], and moved to [place], and saw Urologist, with urodynamics and cystoscopy with dx IC, and Rx Botox ( unknown amount). She moved to [place], with repeat Botox in June, 2016, with success. It has now worn off, and she wants to have Botox injection again.      She has lower abdominal pain. She was previously seeing Dr. Shirlean Mylar (Urologist) and getting Botox. Botox has helped nocturia, and also decreased the pelvic pain and vaginal pain. She has chronic constipation and is taking Tylenol 3 per Dr. Wynelle Link.     PUFF score - 29.   Past Medical History Problems  1. History of No acute medical problems  Surgical History Problems  1. History of Appendectomy 2. History of Hysterectomy  Current Meds 1. Ibuprofen 200 MG Oral Tablet;  Therapy: (Recorded:16Nov2016) to Recorded 2. Tylenol with Codeine #3 TABS;  Therapy: (Recorded:16Nov2016) to Recorded  Allergies Medication  1. Penicillins  Family History Problems  1. No pertinent family history : Mother, Father  Social History Problems    Alcohol use (Z78.9)   Denied: History of Caffeine use   Never a smoker   Consulting civil engineer  Review of Systems Genitourinary, constitutional, skin, eye, otolaryngeal, hematologic/lymphatic, cardiovascular, pulmonary, endocrine, musculoskeletal, gastrointestinal, neurological and psychiatric system(s) were reviewed and  pertinent findings if present are noted and are otherwise negative.  Genitourinary: urinary frequency, urinary urgency, dysuria, nocturia, incontinence, hematuria, pelvic pain, suprapubic pain, perineal pain and dyspareunia, but no vaginal discharge, no inguinal pain and no inguinal swelling.  Gastrointestinal: nausea, vomiting and constipation, but no flank pain, no abdominal pain, no heartburn and no diarrhea.  Constitutional: night sweats and feeling tired (fatigue), but no fever, not feeling poorly (malaise) and no recent weight gain.  Cardiovascular: chest pain, but no leg swelling.  Respiratory: shortness of breath. pain during intercourse no unexplained vaginal bleeding no pregnancy    Vitals Vital Signs [Data Includes: Last 1 Day]  Recorded: 16Nov2016 04:03PM  Height: 5 ft 3 in Weight: 165 lb  BMI Calculated: 29.23 BSA Calculated: 1.78 Blood Pressure: 111 / 75 Temperature: 98.3 F  Physical Exam Constitutional: Well nourished and well developed . No acute distress.  ENT:. The ears and nose are normal in appearance.  Neck: The appearance of the neck is normal and no neck mass is present.  Pulmonary: No respiratory distress and normal respiratory rhythm and effort.  Cardiovascular:. No peripheral edema.  Abdomen: The abdomen is mildly obese, but not distended. No masses are palpated. No suprapubic tenderness. No CVA tenderness. Bowel sounds are normal. No striae. No hernias are palpable. No incisional hernia.  No inguinal hernia is present on the right.  No inguinal hernia is present on the left. not tender The spleen is not enlarged.  Genitourinary:  Chaperone Present: kim lewis.  Examination of the external genitalia shows normal female external genitalia, no vulvar atrophy, no vulvar mass, no condyloma acuminatum and no labial adhesions. The urethra is tender, but normal in appearance  and does not appear stenotic. There is no urethral mass. Urethral hypermobility is not present.  There is no urethral discharge. There is no urethral prolapse. Vaginal exam demonstrates tenderness and the vaginal epithelium to be poorly estrogenized, but no atrophy, no discharge, no uterine prolapse, no mass, no vesicles and no papules. No cystocele is identified. No enterocele is identified. No rectocele is identified. There is no evidence of a vesicovaginal fistula. The cervix is is absent. There is no cervical discharge. The uterus is absent. The adnexa are palpably normal. The bladder is tender, but not distended and without masses.  Lymphatics: The femoral and inguinal nodes are not enlarged or tender.  Skin: Normal skin turgor, no visible rash and no visible skin lesions.  Neuro/Psych:. Mood and affect are appropriate.    Results/Data Urine [Data Includes: Last 1 Day]   16Nov2016  COLOR YELLOW   APPEARANCE CLEAR   SPECIFIC GRAVITY 1.020   pH 5.0   GLUCOSE NEGATIVE   BILIRUBIN NEGATIVE   KETONE NEGATIVE   BLOOD 1+   PROTEIN NEGATIVE   NITRITE NEGATIVE   LEUKOCYTE ESTERASE NEGATIVE   SQUAMOUS EPITHELIAL/HPF 0-5 HPF  WBC 0-5 WBC/HPF  RBC 0-2 RBC/HPF  BACTERIA NONE SEEN HPF  CRYSTALS NONE SEEN HPF  CASTS NONE SEEN LPF  Other    Yeast NONE SEEN HPF  Selected Results  UA With REFLEX 16Nov2016 03:07PM Jakiah Bienaime  SPECIMEN TYPE: CLEAN CATCH   Test Name Result Flag Reference  COLOR YELLOW  YELLOW  ** PLEASE NOTE CHANGE IN UNIT OF MEASURE AND REFERENCE RANGE(S). **  APPEARANCE CLEAR  CLEAR  SPECIFIC GRAVITY 1.020  1.001-1.035  pH 5.0  5.0-8.0  GLUCOSE NEGATIVE  NEGATIVE  BILIRUBIN NEGATIVE  NEGATIVE  KETONE NEGATIVE  NEGATIVE  BLOOD 1+ A NEGATIVE  PROTEIN NEGATIVE  NEGATIVE  NITRITE NEGATIVE  NEGATIVE  LEUKOCYTE ESTERASE NEGATIVE  NEGATIVE  SQUAMOUS EPITHELIAL/HPF 0-5 HPF  <=5  WBC 0-5 WBC/HPF  <=5  RBC 0-2 RBC/HPF  <=2  BACTERIA NONE SEEN HPF  NONE SEEN  CRYSTALS NONE SEEN HPF  NONE SEEN  CASTS NONE SEEN LPF  NONE SEEN  Other     FEW MUCOUS THREADS   Yeast NONE SEEN HPF  NONE SEEN   Assessment Assessed  1. Chronic interstitial cystitis without hematuria (N30.10)  Interstitial with high PUFF score of 29. She does not have levator spasm on exam today. She reports improvement with Botox bladder injection, and we will schedule her injection as an in-office injection. She reports tht she isn now [place] Medicaid,and we will need to ck with Medicaid for pre-approval.   Plan Valium and Levaquin as pre medication. ]  RTC for notos injection in bladder submucosa -but must have Medicaid per-approval.   Discussion/Summary cc: Salli RealYun Sun, MD, Urgent Care     Signatures Electronically signed by : Jethro BolusSigmund Alesha Jaffee, M.D.; Oct 13 2015  5:24PM EST

## 2015-10-29 NOTE — Transfer of Care (Signed)
Immediate Anesthesia Transfer of Care Note  Patient: Stephanie Erickson  Procedure(s) Performed: Procedure(s): CYSTOSCOPY WITH BOTOX   (N/A)  Patient Location: PACU  Anesthesia Type:General  Level of Consciousness: awake, alert , oriented and patient cooperative  Airway & Oxygen Therapy: Patient Spontanous Breathing and Patient connected to nasal cannula oxygen  Post-op Assessment: Report given to RN and Post -op Vital signs reviewed and stable  Post vital signs: Reviewed and stable  Last Vitals:  Filed Vitals:   10/29/15 0902 10/29/15 1037  BP: 117/71 126/85  Pulse: 78 85  Temp: 36.7 C 36.4 C  Resp: 12 16    Complications: No apparent anesthesia complications

## 2015-10-29 NOTE — Discharge Instructions (Addendum)
Botulinum Toxin Bladder Injection A botulinum toxin bladder injection is a procedure to treat an overactive bladder. During the procedure, a drug called botulinum toxin is injected into the bladder through a long, thin needle. This drug relaxes the bladder muscles and reduces overactivity. You may need this procedure if your medicines are not working or you cannot take them. The procedure may be repeated as needed. LET [facility] CARE PROVIDER KNOW ABOUT:  Any allergies you have.  All medicines you are taking, including vitamins, herbs, eye drops, creams, and over-the-counter medicines.  Previous problems you or members of your family have had with the use of anesthetics.  Any blood disorders you have.  Previous surgeries you have had.  Other medical conditions you have.  Any previous reactions to a botulinum toxin injection.  Any symptoms of urinary tract infection. These include chills, fever, a burning feeling when passing urine, and needing to pass urine often. RISKS AND COMPLICATIONS Generally this is a safe procedure. However, problems may occur, including:  Not being able to pass urine. If this happens, you may need to have your bladder emptied with a thin tube (bladder catheter).  Bleeding.  Urinary tract infection.  Allergic reaction to the botulinum toxin.  Pain or burning when passing urine.  Damage to other structures or organs. BEFORE THE PROCEDURE  Ask your health care provider about:  Changing or stopping your regular medicines. This is especially important if you are taking diabetes medicines or blood thinners.  Taking medicines such as aspirin and ibuprofen. These medicines can thin your blood. Do not take these medicines before your procedure if your health care provider instructs you not to.  Follow instructions from your health care provider about eating or drinking restrictions.  You may be given antibiotic medicine to help prevent infection.  Plan  to have someone take you home after the procedure. PROCEDURE  You will be asked to empty your bladder.  To reduce your risk of infection:  Your health care team will wash or sanitize their hands.  Your skin will be washed with soap.  An IV tube will be inserted into one of your veins.  You will be given one or more of the following:  A medicine to help you relax (sedative).  A medicine to numb the area (local anesthetic).  A medicine to make you fall asleep (general anesthetic).  A long, thin scope called a cystoscope will be passed into your bladder through the tube that carries urine from your bladder (urethra).  The cystoscope will be used to fill your bladder with water.  A long needle will be passed through the cystoscope and into the bladder.  The botulinum toxin will be injected into your bladder. It may be injected into multiple areas of your bladder.  Your bladder will be emptied, and the cystoscope will be removed. The procedure may vary among health care providers and hospitals. AFTER THE PROCEDURE  Your blood pressure, heart rate, breathing rate, and blood oxygen level will be monitored often until the medicines you were given have worn off.  Do not drive for 24 hours if you received a sedative.   This information is not intended to replace advice given to you by your health care provider. Make sure you discuss any questions you have with your health care provider.   Document Released: 08/04/2015 Document Reviewed: 05/12/2015 Elsevier Interactive Patient Education 2016 [employer].   Post Anesthesia Home Care Instructions  Activity: Get plenty of rest for  the remainder of the day. A responsible adult should stay with you for 24 hours following the procedure.  For the next 24 hours, DO NOT: -Drive a car -Advertising copywriterperate machinery -Drink alcoholic beverages -Take any medication unless instructed by your physician -Make any legal decisions or sign important  papers.  Meals: Start with liquid foods such as gelatin or soup. Progress to regular foods as tolerated. Avoid greasy, spicy, heavy foods. If nausea and/or vomiting occur, drink only clear liquids until the nausea and/or vomiting subsides. Call your physician if vomiting continues.  Special Instructions/Symptoms: Your throat may feel dry or sore from the anesthesia or the breathing tube placed in your throat during surgery. If this causes discomfort, gargle with warm salt water. The discomfort should disappear within 24 hours.  If you had a scopolamine patch placed behind your ear for the management of post- operative nausea and/or vomiting:  1. The medication in the patch is effective for 72 hours, after which it should be removed.  Wrap patch in a tissue and discard in the trash. Wash hands thoroughly with soap and water. 2. You may remove the patch earlier than 72 hours if you experience unpleasant side effects which may include dry mouth, dizziness or visual disturbances. 3. Avoid touching the patch. Wash your hands with soap and water after contact with the patch.

## 2015-10-29 NOTE — Interval H&P Note (Signed)
History and Physical Interval Note:  10/29/2015 9:46 AM  Stephanie BirksAshley Erickson  has presented today for surgery, with the diagnosis of INTERSTITIAL CYSTITIS  The various methods of treatment have been discussed with the patient and family. After consideration of risks, benefits and other options for treatment, the patient has consented to  Procedure(s): CYSTOSCOPY WITH BOTOX   (N/A) as a surgical intervention .  The patient's history has been reviewed, patient examined, no change in status, stable for surgery.  I have reviewed the patient's chart and labs.  Questions were answered to the patient's satisfaction.   Pt for Botox injection today.   Addilee Neu I Aldric Wenzler

## 2015-10-29 NOTE — Anesthesia Postprocedure Evaluation (Signed)
Anesthesia Post Note  Patient: Stephanie Erickson  Procedure(s) Performed: Procedure(s) (LRB): CYSTOSCOPY WITH BOTOX   (N/A)  Patient location during evaluation: PACU Anesthesia Type: General Level of consciousness: awake and alert Pain management: pain level controlled Vital Signs Assessment: post-procedure vital signs reviewed and stable Respiratory status: spontaneous breathing, nonlabored ventilation, respiratory function stable and patient connected to nasal cannula oxygen Cardiovascular status: blood pressure returned to baseline and stable Postop Assessment: no signs of nausea or vomiting Anesthetic complications: no    Last Vitals:  Filed Vitals:   10/29/15 1100 10/29/15 1115  BP: 120/85 118/77  Pulse: 67 72  Temp:    Resp: 20 12    Last Pain:  Filed Vitals:   10/29/15 1127  PainSc: 8                  Reason Helzer L

## 2015-11-01 ENCOUNTER — Encounter (HOSPITAL_BASED_OUTPATIENT_CLINIC_OR_DEPARTMENT_OTHER): Payer: Self-pay | Admitting: Urology

## 2016-03-04 ENCOUNTER — Encounter (HOSPITAL_COMMUNITY): Payer: Self-pay | Admitting: Emergency Medicine

## 2016-03-04 ENCOUNTER — Emergency Department (HOSPITAL_COMMUNITY)
Admission: EM | Admit: 2016-03-04 | Discharge: 2016-03-04 | Disposition: A | Discharge status: Home / Self Care | Payer: Medicaid Other | Attending: Emergency Medicine | Admitting: Emergency Medicine

## 2016-03-04 ENCOUNTER — Emergency Department (HOSPITAL_COMMUNITY): Payer: Medicaid Other

## 2016-03-04 DIAGNOSIS — Z79899 Other long term (current) drug therapy: Secondary | ICD-10-CM | POA: Insufficient documentation

## 2016-03-04 DIAGNOSIS — Z880 Allergy status to penicillin: Secondary | ICD-10-CM | POA: Diagnosis not present

## 2016-03-04 DIAGNOSIS — R0602 Shortness of breath: Secondary | ICD-10-CM | POA: Insufficient documentation

## 2016-03-04 DIAGNOSIS — R0789 Other chest pain: Secondary | ICD-10-CM | POA: Insufficient documentation

## 2016-03-04 DIAGNOSIS — R079 Chest pain, unspecified: Secondary | ICD-10-CM | POA: Diagnosis present

## 2016-03-04 DIAGNOSIS — Z87448 Personal history of other diseases of urinary system: Secondary | ICD-10-CM | POA: Diagnosis not present

## 2016-03-04 LAB — BASIC METABOLIC PANEL
ANION GAP: 13 (ref 5–15)
BUN: 12 mg/dL (ref 6–20)
CHLORIDE: 106 mmol/L (ref 101–111)
CO2: 23 mmol/L (ref 22–32)
Calcium: 10.1 mg/dL (ref 8.9–10.3)
Creatinine, Ser: 0.75 mg/dL (ref 0.44–1.00)
GFR calc Af Amer: >60 mL/min (ref >60)
GLUCOSE: 80 mg/dL (ref 65–99)
POTASSIUM: 3.8 mmol/L (ref 3.5–5.1)
Sodium: 142 mmol/L (ref 135–145)

## 2016-03-04 LAB — CBC
HEMATOCRIT: 38.3 % (ref 36.0–46.0)
HEMOGLOBIN: 13.4 g/dL (ref 12.0–15.0)
MCH: 30.2 pg (ref 26.0–34.0)
MCHC: 35 g/dL (ref 30.0–36.0)
MCV: 86.5 fL (ref 78.0–100.0)
Platelets: 273 10*3/uL (ref 150–400)
RBC: 4.43 MIL/uL (ref 3.87–5.11)
RDW: 12.5 % (ref 11.5–15.5)
WBC: 9.1 10*3/uL (ref 4.0–10.5)

## 2016-03-04 LAB — I-STAT TROPONIN, ED
Troponin i, poc: 0 ng/mL (ref 0.00–0.08)
Troponin i, poc: 0 ng/mL (ref 0.00–0.08)

## 2016-03-04 LAB — D-DIMER, QUANTITATIVE: D-Dimer, Quant: <0.27 ug/mL-FEU (ref 0.00–0.50)

## 2016-03-04 MED ORDER — MORPHINE SULFATE (PF) 4 MG/ML IV SOLN
4.0000 mg | Freq: Once | INTRAVENOUS | Status: AC
  Administered 2016-03-04: 4 mg via INTRAVENOUS
  Filled 2016-03-04: qty 1

## 2016-03-04 MED ORDER — KETOROLAC TROMETHAMINE 30 MG/ML IJ SOLN
30.0000 mg | Freq: Once | INTRAMUSCULAR | Status: DC
  Filled 2016-03-04: qty 1

## 2016-03-04 MED ORDER — KETOROLAC TROMETHAMINE 30 MG/ML IJ SOLN
30.0000 mg | Freq: Once | INTRAMUSCULAR | Status: AC
  Administered 2016-03-04: 30 mg via INTRAVENOUS

## 2016-03-04 MED ORDER — ASPIRIN 81 MG PO CHEW
324.0000 mg | CHEWABLE_TABLET | Freq: Once | ORAL | Status: AC
  Administered 2016-03-04: 324 mg via ORAL
  Filled 2016-03-04: qty 4

## 2016-03-04 NOTE — ED Provider Notes (Signed)
CSN: [id number]     Arrival date & time 03/04/16  1636 History   First MD Initiated Contact with Patient 03/04/16 1805     Chief Complaint  Patient presents with  . Chest Pain     (Consider location/radiation/quality/duration/timing/severity/associated sxs/prior Treatment) HPI Comments: Patient presents today with a chief complaint of chest pain.  She reports acute onset of chest pain just prior to arrival when she was carrying laundry up the stairs.  She states that the pain has been constant since that time.  Pain located across the chest and does not radiate.  She states that the pain has been constant since that time.  Pain is worse when taking a deep breath.  Pain is associated with mild SOB.  No cough, hemoptysis, fever, chills, nausea, vomiting, dizziness, syncope, LE edema, or any other symptoms at this time.  She denies any prior cardiac history.  No history of HTN, Hyperlipidemia, DM, or smoking.  No FH of cardiac disease.  No history of DVT or PE.  No prolonged travel or surgeries in the past 4 weeks.  No exogenous estrogen use.  Patient is a 40 y.o. female presenting with chest pain. The history is provided by the patient.  Chest Pain   Past Medical History  Diagnosis Date  . Frequency of urination   . Urgency of urination   . IC (interstitial cystitis)    Past Surgical History  Procedure Laterality Date  . Appendectomy  age 44  . Total abdominal hysterectomy w/ bilateral salpingoophorectomy  2011  . Cysto/  botox injection  x2  June 2016  . Cystoscopy N/A 10/29/2015    Procedure: CYSTOSCOPY WITH BOTOX  ;  Surgeon: Jethro Bolus, MD;  Location: [facility];  Service: Urology;  Laterality: N/A;   No family history on file. Social History  Substance Use Topics  . Smoking status: Never Smoker   . Smokeless tobacco: Never Used  . Alcohol Use: No   OB History    No data available     Review of Systems  Cardiovascular: Positive for chest pain.  All  other systems reviewed and are negative.     Allergies  Penicillins  Home Medications   Prior to Admission medications   Medication Sig Start Date End Date Taking? Authorizing Provider  phentermine 37.5 MG capsule Take 37.5 mg by mouth every morning.   Yes Historical Provider, MD  traMADol-acetaminophen (ULTRACET) 37.5-325 MG tablet Take 1 tablet by mouth every 6 (six) hours as needed for severe pain. 11/08/15  Yes Jethro Bolus, MD  ondansetron (ZOFRAN ODT) 4 MG disintegrating tablet  ODT q4 hours prn nausea/vomit Patient not taking: Reported on 03/04/2016 09/17/15   Nada Boozer Hess, PA-C  promethazine (PHENERGAN) 25 MG tablet Take 1 tablet (25 mg total) by mouth every 6 (six) hours as needed for nausea or vomiting. Patient not taking: Reported on 03/04/2016 10/03/15   Joycie Peek, PA-C  trimethoprim (TRIMPEX) 100 MG tablet Take 1 tablet (100 mg total) by mouth daily. Patient not taking: Reported on 03/04/2016 10/29/15   Jethro Bolus, MD  URELLE (URELLE/URISED) 81 MG TABS tablet Take 1 tablet (81 mg total) by mouth 3 (three) times daily. Patient not taking: Reported on 03/04/2016 10/29/15   Jethro Bolus, MD   BP 126/87 mmHg  Pulse 77  Temp(Src) 97.7 F (36.5 C) (Oral)  Resp 16  SpO2 99% Physical Exam  Constitutional: She appears well-developed and well-nourished. No distress.  HENT:  Head: Normocephalic and atraumatic.  Mouth/Throat: Oropharynx is clear and moist.  Neck: Normal range of motion. Neck supple.  Cardiovascular: Normal rate, regular rhythm, normal heart sounds and intact distal pulses.   Pulmonary/Chest: Effort normal and breath sounds normal. No respiratory distress. She has no wheezes. She has no rales. She exhibits tenderness.  Abdominal: Soft. There is no tenderness.  Musculoskeletal: Normal range of motion.  No LE edema or erythema  Neurological: She is alert.  Skin: Skin is warm and dry. She is not diaphoretic.  Psychiatric: She has a normal  mood and affect.  Nursing note and vitals reviewed.   ED Course  Procedures (including critical care time) Labs Review Labs Reviewed  BASIC METABOLIC PANEL  CBC  I-STAT TROPOININ, ED    Imaging Review Dg Chest 2 View  03/04/2016  CLINICAL DATA:  40 year old presenting with acute onset of chest pain which began 10/03/2015. EXAM: CHEST  2 VIEW COMPARISON:  10/03/2015. FINDINGS: Cardiac silhouette normal in size, unchanged. Minimal atherosclerosis involving the aortic arch. Hilar and mediastinal contours otherwise unremarkable. Lungs clear. Bronchovascular markings normal. Pulmonary vascularity normal. No visible pleural effusions. No pneumothorax. Visualized bony thorax intact. IMPRESSION: No acute cardiopulmonary disease. Electronically Signed   By: Hulan Saashomas  Lawrence M.D.   On: 03/04/2016 17:21   I have personally reviewed and evaluated these images and lab results as part of my medical decision-making.   EKG Interpretation   Date/Time:  Saturday March 04 2016 16:46:55 EDT Ventricular Rate:  79 PR Interval:  146 QRS Duration: 82 QT Interval:  356 QTC Calculation: 408 R Axis:   66 Text Interpretation:  Sinus rhythm RSR' in V1 or V2, right VCD or RVH  Baseline wander in lead(s) III ED PHYSICIAN INTERPRETATION AVAILABLE IN  [facility] Confirmed by TEST, Record ([id number]) on 03/05/2016 9:04:19 AM      MDM   Final diagnoses:  None   Patient presents today with atypical chest pain.   No ischemic changes on EKG.  Initial and delta troponin are negative. HEART score of 0.  CXR is negative.  D-dimer negative.  Chest wall is tender to palpation.  Pain improved in the ED.  Feel that the patient is stable for discharge.  Return precautions given.      Santiago GladHeather Jerrell Mangel, PA-C 03/05/16 2249  Arby BarretteMarcy Pfeiffer, MD 03/09/16 [phone]

## 2016-03-04 NOTE — ED Notes (Signed)
Patient transported to X-ray 

## 2016-03-04 NOTE — ED Notes (Signed)
Pt reports sudden onset generalized chest pain and chills; denies other symptoms or hx of such.

## 2016-03-15 ENCOUNTER — Other Ambulatory Visit: Payer: Self-pay | Admitting: Urology

## 2016-03-20 ENCOUNTER — Encounter (HOSPITAL_BASED_OUTPATIENT_CLINIC_OR_DEPARTMENT_OTHER): Payer: Self-pay | Admitting: *Deleted

## 2016-03-20 MED ORDER — ONABOTULINUMTOXINA 100 UNITS IJ SOLR
200.0000 [IU] | Freq: Once | INTRAMUSCULAR | Status: AC
Start: 2016-03-20 — End: ?

## 2016-03-20 NOTE — Progress Notes (Signed)
[facility]-current lab work with chart-Instructed Npo after Mn.

## 2016-03-25 NOTE — H&P (Signed)
Reason For Visit F/u PVR & to schedule Botox   Active Problems Problems  1. Abdominal pain, RLQ (right lower quadrant) (R10.31) 2. Abdominal pain, suprapubic (R10.2) 3. Chronic interstitial cystitis without hematuria (N30.10)   Assessed By: Jethro Bolusannenbaum, Dhanvin Szeto (Urology); Last Assessed: 13 Mar 2016 4. Overactive bladder (N32.81)  History of Present Illness    40 YO Medicaid female returns today for a f/u PVR & to schedule Botox. She is s/p cysto/HOD (650 ml) and injection of Botox 100 units 10/29/15. [facility] b/c Medicaid will only cover Hospital based injection.  She did well after the procedure with no pain. "I just love Botox! This is my 3rd and will never have nothing else done instead. It keeps me off pain meds." . She is post appendectomy. No hx of diarrhea or bowel problems. No diverticulitis. No diarrhea, No rectal bleeding. No constipation. She is having 1 BM /day. She  has a baseline pain of 8/10, rising 10 with spasms.    She is post TAH/BSO for :" abnormal cells" in 2011.     Urine culture Nov 2016 multiple species.   Past Medical History Problems  1. History of No acute medical problems  Surgical History Problems  1. History of Appendectomy 2. History of Cystoscopy With Injection For Chemodenervation Of Bladder 3. History of Hysterectomy  Current Meds 1. Dicyclomine HCl - 20 MG Oral Tablet; TAKE 1 TABLET Bedtime X 1WK THEN ONE  TABLET TWICE A DAY;  Therapy: 06Mar2017 to (Last Rx:06Mar2017)  Requested for: 06Mar2017 Ordered  Allergies Medication  1. Penicillins  Family History Problems  1. No pertinent family history : Mother, Father  Social History Problems  1. Alcohol use (Z78.9)   occassionally 2. Denied: History of Caffeine use 3. Never a smoker 4. Student  Review of Systems Genitourinary, constitutional, skin, eye, otolaryngeal, hematologic/lymphatic, cardiovascular, pulmonary, endocrine, musculoskeletal,  gastrointestinal, neurological and psychiatric system(s) were reviewed and pertinent findings if present are noted and are otherwise negative.  Genitourinary: urinary frequency, hematuria and bladder pain.    Vitals Vital Signs [Data Includes: Last 1 Day]  Recorded: 17Apr2017 01:08PM  Blood Pressure: 107 / 75 Temperature: 98.3 F Heart Rate: 99  Physical Exam Constitutional: Well nourished and well developed . No acute distress.  ENT:. The ears and nose are normal in appearance.  Neck: The appearance of the neck is normal and no neck mass is present.  Pulmonary: No respiratory distress and normal respiratory rhythm and effort.  Cardiovascular: Heart rate and rhythm are normal . No peripheral edema.  Abdomen: Incision site(s) healing well. The abdomen is mildly obese, but not distended. The abdomen is soft and nontender. No masses are palpated. The abdomen is normal to percussion. The abdomen is not firm, not rigid, no rebound and no guarding. Mild tenderness in the RLQ is present, but no RUQ tenderness, no LUQ tenderness and no LLQ tenderness. No CVA tenderness. Bowel sounds are normal. No hernias are palpable. No hepatosplenomegaly noted.  Lymphatics: The femoral and inguinal nodes are not enlarged or tender.  Skin: Normal skin turgor, no visible rash and no visible skin lesions.  Neuro/Psych:. Mood and affect are appropriate.    Results/Data Urine [Data Includes: Last 1 Day]   17Apr2017  COLOR YELLOW   APPEARANCE CLEAR   SPECIFIC GRAVITY 1.020   pH 6.0   GLUCOSE NEGATIVE   BILIRUBIN NEGATIVE   KETONE NEGATIVE   BLOOD 1+   PROTEIN NEGATIVE   NITRITE NEGATIVE   LEUKOCYTE  ESTERASE TRACE   SQUAMOUS EPITHELIAL/HPF 10-20 HPF  WBC 0-5 WBC/HPF  RBC 0-2 RBC/HPF  BACTERIA FEW HPF  CRYSTALS NONE SEEN HPF  CASTS NONE SEEN LPF  Yeast NONE SEEN HPF   Assessment Assessed  1. Chronic interstitial cystitis without hematuria (N30.10) 2. Overactive bladder (N32.81) 3. Abdominal pain,  suprapubic (R10.2)  Naleah is now 5 months post 100 unit Botox injection at [facility]. She did very well with her OAB until 1 week ago, with onset on of s-p pressure and nocturia x 5. She will try to increase to 200units injection, understanding, that she might need to learn how to self-cath, if she is not able to void spontaneously.   Plan Abdominal pain, suprapubic, Chronic interstitial cystitis without hematuria, Overactive bladder  1. Follow-up Schedule Surgery Office  Follow-up  Status: Hold For - Appointment   Requested for: 17Apr2017 Health Maintenance  2. UA With REFLEX; [Do Not Release]; Status:Resulted - Requires Verification;   Done:  17Apr2017 12:56PM  Schedule Botox.   Discussion/Summary cc: [facility].     Amendment  [id number]     1 Amended By: Jethro Bolus; Mar 13 2016 1:40 PM EST  Signatures Electronically signed by : Jethro Bolus, M.D.; Mar 13 2016  1:40PM EST

## 2016-03-27 ENCOUNTER — Ambulatory Visit (HOSPITAL_BASED_OUTPATIENT_CLINIC_OR_DEPARTMENT_OTHER): Payer: Medicaid Other | Admitting: Anesthesiology

## 2016-03-27 ENCOUNTER — Encounter (HOSPITAL_BASED_OUTPATIENT_CLINIC_OR_DEPARTMENT_OTHER): Payer: Self-pay | Admitting: *Deleted

## 2016-03-27 ENCOUNTER — Encounter (HOSPITAL_BASED_OUTPATIENT_CLINIC_OR_DEPARTMENT_OTHER)
Admission: RE | Disposition: A | Discharge status: Home / Self Care | Payer: Self-pay | Source: Ambulatory Visit | Attending: Urology

## 2016-03-27 ENCOUNTER — Ambulatory Visit (HOSPITAL_BASED_OUTPATIENT_CLINIC_OR_DEPARTMENT_OTHER)
Admission: RE | Admit: 2016-03-27 | Discharge: 2016-03-27 | Disposition: A | Discharge status: Home / Self Care | Payer: Medicaid Other | Source: Ambulatory Visit | Attending: Urology | Admitting: Urology

## 2016-03-27 DIAGNOSIS — N3281 Overactive bladder: Secondary | ICD-10-CM | POA: Diagnosis not present

## 2016-03-27 DIAGNOSIS — Z90710 Acquired absence of both cervix and uterus: Secondary | ICD-10-CM | POA: Diagnosis not present

## 2016-03-27 DIAGNOSIS — Z9049 Acquired absence of other specified parts of digestive tract: Secondary | ICD-10-CM | POA: Insufficient documentation

## 2016-03-27 DIAGNOSIS — Z79899 Other long term (current) drug therapy: Secondary | ICD-10-CM | POA: Diagnosis not present

## 2016-03-27 DIAGNOSIS — N3010 Interstitial cystitis (chronic) without hematuria: Secondary | ICD-10-CM | POA: Insufficient documentation

## 2016-03-27 HISTORY — PX: CYSTOSCOPY WITH INJECTION: SHX1424

## 2016-03-27 SURGERY — CYSTOSCOPY, WITH INJECTION OF BLADDER NECK OR BLADDER WALL
Anesthesia: General | Site: Bladder

## 2016-03-27 MED ORDER — CIPROFLOXACIN IN D5W 400 MG/200ML IV SOLN
400.0000 mg | INTRAVENOUS | Status: AC
  Administered 2016-03-27: 400 mg via INTRAVENOUS
  Filled 2016-03-27: qty 200

## 2016-03-27 MED ORDER — STERILE WATER FOR IRRIGATION IR SOLN
Status: DC | PRN
  Administered 2016-03-27: 3000 mL

## 2016-03-27 MED ORDER — LIDOCAINE HCL (CARDIAC) 20 MG/ML IV SOLN
INTRAVENOUS | Status: AC
  Filled 2016-03-27: qty 5

## 2016-03-27 MED ORDER — CIPROFLOXACIN IN D5W 400 MG/200ML IV SOLN
INTRAVENOUS | Status: AC
  Filled 2016-03-27: qty 200

## 2016-03-27 MED ORDER — LACTATED RINGERS IV SOLN
INTRAVENOUS | Status: DC
  Administered 2016-03-27: 11:00:00 via INTRAVENOUS
  Filled 2016-03-27: qty 1000

## 2016-03-27 MED ORDER — METOCLOPRAMIDE HCL 5 MG/ML IJ SOLN
INTRAMUSCULAR | Status: DC | PRN
  Administered 2016-03-27 (×2): 5 mg via INTRAVENOUS

## 2016-03-27 MED ORDER — SODIUM CHLORIDE 0.9 % IJ SOLN
INTRAMUSCULAR | Status: DC | PRN
  Administered 2016-03-27: 22 mL

## 2016-03-27 MED ORDER — PROPOFOL 10 MG/ML IV BOLUS
INTRAVENOUS | Status: AC
  Filled 2016-03-27: qty 20

## 2016-03-27 MED ORDER — PROPOFOL 10 MG/ML IV BOLUS
INTRAVENOUS | Status: DC | PRN
  Administered 2016-03-27: 250 mg via INTRAVENOUS

## 2016-03-27 MED ORDER — FENTANYL CITRATE (PF) 100 MCG/2ML IJ SOLN
25.0000 ug | INTRAMUSCULAR | Status: DC | PRN
  Administered 2016-03-27: 25 ug via INTRAVENOUS
  Filled 2016-03-27: qty 1

## 2016-03-27 MED ORDER — DEXAMETHASONE SODIUM PHOSPHATE 10 MG/ML IJ SOLN
INTRAMUSCULAR | Status: AC
  Filled 2016-03-27: qty 1

## 2016-03-27 MED ORDER — LACTATED RINGERS IV SOLN
INTRAVENOUS | Status: DC
  Filled 2016-03-27: qty 1000

## 2016-03-27 MED ORDER — FENTANYL CITRATE (PF) 100 MCG/2ML IJ SOLN
INTRAMUSCULAR | Status: AC
  Filled 2016-03-27: qty 2

## 2016-03-27 MED ORDER — MIDAZOLAM HCL 5 MG/5ML IJ SOLN
INTRAMUSCULAR | Status: DC | PRN
  Administered 2016-03-27: 2 mg via INTRAVENOUS

## 2016-03-27 MED ORDER — FENTANYL CITRATE (PF) 100 MCG/2ML IJ SOLN
INTRAMUSCULAR | Status: DC | PRN
  Administered 2016-03-27 (×2): 50 ug via INTRAVENOUS

## 2016-03-27 MED ORDER — ACETAMINOPHEN 10 MG/ML IV SOLN
INTRAVENOUS | Status: AC
  Filled 2016-03-27: qty 100

## 2016-03-27 MED ORDER — ACETAMINOPHEN 10 MG/ML IV SOLN
INTRAVENOUS | Status: DC | PRN
  Administered 2016-03-27: 1000 mg via INTRAVENOUS

## 2016-03-27 MED ORDER — PROMETHAZINE HCL 25 MG/ML IJ SOLN
INTRAMUSCULAR | Status: DC | PRN
  Administered 2016-03-27: 12.5 mg via INTRAVENOUS

## 2016-03-27 MED ORDER — ONABOTULINUMTOXINA 100 UNITS IJ SOLR
INTRAMUSCULAR | Status: DC | PRN
  Administered 2016-03-27: 200 [IU] via INTRAMUSCULAR

## 2016-03-27 MED ORDER — KETOROLAC TROMETHAMINE 30 MG/ML IJ SOLN
INTRAMUSCULAR | Status: DC | PRN
  Administered 2016-03-27: 30 mg via INTRAVENOUS

## 2016-03-27 MED ORDER — LIDOCAINE HCL (CARDIAC) 20 MG/ML IV SOLN
INTRAVENOUS | Status: DC | PRN
  Administered 2016-03-27: 100 mg via INTRAVENOUS

## 2016-03-27 MED ORDER — KETOROLAC TROMETHAMINE 30 MG/ML IJ SOLN
INTRAMUSCULAR | Status: AC
  Filled 2016-03-27: qty 1

## 2016-03-27 MED ORDER — DEXAMETHASONE SODIUM PHOSPHATE 4 MG/ML IJ SOLN
INTRAMUSCULAR | Status: DC | PRN
  Administered 2016-03-27: 10 mg via INTRAVENOUS

## 2016-03-27 MED ORDER — PROMETHAZINE HCL 25 MG/ML IJ SOLN
INTRAMUSCULAR | Status: AC
  Filled 2016-03-27: qty 1

## 2016-03-27 MED ORDER — ONDANSETRON HCL 4 MG/2ML IJ SOLN
INTRAMUSCULAR | Status: AC
  Filled 2016-03-27: qty 2

## 2016-03-27 MED ORDER — MIDAZOLAM HCL 2 MG/2ML IJ SOLN
INTRAMUSCULAR | Status: AC
  Filled 2016-03-27: qty 2

## 2016-03-27 MED ORDER — METOCLOPRAMIDE HCL 5 MG/ML IJ SOLN
INTRAMUSCULAR | Status: AC
  Filled 2016-03-27: qty 2

## 2016-03-27 SURGICAL SUPPLY — 22 items
BAG DRAIN URO-CYSTO SKYTR STRL (DRAIN) ×3 IMPLANT
BOOTIES KNEE HIGH SLOAN (MISCELLANEOUS) ×3 IMPLANT
CLOTH BEACON ORANGE TIMEOUT ST (SAFETY) ×3 IMPLANT
GLOVE BIO SURGEON STRL SZ7.5 (GLOVE) ×3 IMPLANT
GLOVE SURG SS PI 7.5 STRL IVOR (GLOVE) ×6 IMPLANT
GOWN STRL REUS W/ TWL LRG LVL3 (GOWN DISPOSABLE) ×1 IMPLANT
GOWN STRL REUS W/ TWL XL LVL3 (GOWN DISPOSABLE) ×1 IMPLANT
GOWN STRL REUS W/TWL LRG LVL3 (GOWN DISPOSABLE) ×2
GOWN STRL REUS W/TWL XL LVL3 (GOWN DISPOSABLE) ×2
KIT ROOM TURNOVER WOR (KITS) ×3 IMPLANT
MANIFOLD NEPTUNE II (INSTRUMENTS) ×3 IMPLANT
NDL SAFETY ECLIPSE 18X1.5 (NEEDLE) ×1 IMPLANT
NEEDLE HYPO 18GX1.5 SHARP (NEEDLE) ×2
NEEDLE RIGID UROPLASTY (NEEDLE) IMPLANT
NEEDLE SPNL 22GX7 QUINCKE BK (NEEDLE) IMPLANT
PACK CYSTO (CUSTOM PROCEDURE TRAY) ×3 IMPLANT
SYR 20CC LL (SYRINGE) ×3 IMPLANT
SYR BULB IRRIGATION 50ML (SYRINGE) IMPLANT
SYRINGE 10CC LL (SYRINGE) ×6 IMPLANT
TUBE CONNECTING 12'X1/4 (SUCTIONS) ×1
TUBE CONNECTING 12X1/4 (SUCTIONS) ×2 IMPLANT
WATER STERILE IRR 3000ML UROMA (IV SOLUTION) ×3 IMPLANT

## 2016-03-27 NOTE — Anesthesia Preprocedure Evaluation (Signed)
Anesthesia Evaluation  Patient identified by MRN, date of birth, ID band Patient awake    Reviewed: Allergy & Precautions, H&P , NPO status , Patient's Chart, lab work & pertinent test results  Airway Mallampati: II  TM Distance: >3 FB Neck ROM: full    Dental no notable dental hx. (+) Dental Advisory Given, Teeth Intact   Pulmonary neg pulmonary ROS,    Pulmonary exam normal breath sounds clear to auscultation       Cardiovascular Exercise Tolerance: Good negative cardio ROS Normal cardiovascular exam Rhythm:regular Rate:Normal     Neuro/Psych negative neurological ROS  negative psych ROS   GI/Hepatic negative GI ROS, Neg liver ROS,   Endo/Other  negative endocrine ROS  Renal/GU IC  negative genitourinary   Musculoskeletal   Abdominal   Peds  Hematology negative hematology ROS (+)   Anesthesia Other Findings   Reproductive/Obstetrics negative OB ROS                             Anesthesia Physical Anesthesia Plan  ASA: I  Anesthesia Plan: General   Post-op Pain Management:    Induction: Intravenous  Airway Management Planned: LMA  Additional Equipment:   Intra-op Plan:   Post-operative Plan:   Informed Consent:   Plan Discussed with:   Anesthesia Plan Comments:         Anesthesia Quick Evaluation

## 2016-03-27 NOTE — Transfer of Care (Signed)
Immediate Anesthesia Transfer of Care Note  Patient: Stephanie Erickson  Procedure(s) Performed: Procedure(s) (LRB): CYSTOSCOPY WITH INJECTION, BLADDER BOTOX INJECTION 200 UNITS (N/A)  Patient Location: PACU  Anesthesia Type: General  Level of Consciousness: awake, alert  and oriented  Airway & Oxygen Therapy: Patient Spontanous Breathing and Patient connected to nasal cannula oxygen  Post-op Assessment: Report given to PACU RN and Post -op Vital signs reviewed and stable  Post vital signs: Reviewed and stable  Complications: No apparent anesthesia complicationsLast Vitals:  Filed Vitals:   03/27/16 1228 03/27/16 1230  BP: 104/55 100/56  Pulse: 79 74  Temp: 36.6 C   Resp: 19 18    Last Pain:  Filed Vitals:   03/27/16 1234  PainSc: 8       Patients Stated Pain Goal: 10 (03/27/16 1038)

## 2016-03-27 NOTE — Op Note (Signed)
Pre-operative diagnosis :   Overactive bladder, minimally sensitive to Botox, 100 unit injection  Postoperative diagnosis:  Same  Operation:  Cystourethroscopy, injection of Botox, 200 units submucosal bladder injections  Surgeon:  S. Patsi Searsannenbaum, MD  First assistant: None    Anesthesia:  Gen. LMA   Preparation:  After appropriate preanesthesia, the patient was brought the operating room, placed on the operative table in the dorsal supine position where general LMA anesthesia was introduced. She was then replaced in the dorsal lithotomy position where the pubis was prepped with Betadine solution and draped in usual fashion. The history was reviewed. Timeout was observed. It is noted the patient was extensively counseled with regard to increased Botox injection complications. She is now for 200 units bladder Botox injection for severe overactive bladder symptoms.   Review history:  Problems  1. Abdominal pain, RLQ (right lower quadrant) (R10.31) 2. Abdominal pain, suprapubic (R10.2) 3. Chronic interstitial cystitis without hematuria (N30.10)  Assessed By: Jethro Bolusannenbaum, Macil Crady (Urology); Last Assessed: 13 Mar 2016 4. Overactive bladder (N32.81)  History of Present Illness   40 YO Medicaid female returns today for a f/u PVR & to schedule Botox. She is s/p cysto/HOD (650 ml) and injection of Botox 100 units 10/29/15. [facility], b/c Medicaid will only cover Hospital based injection. She did well after the procedure with no pain. "I just love Botox! This is my 3rd and will never have nothing else done instead. It keeps me off pain meds." . She is post appendectomy. No hx of diarrhea or bowel problems. No diverticulitis. No diarrhea, No rectal bleeding. No constipation. She is having 1 BM /day. She has a baseline pain of 8/10, rising 10 with spasms.   She is post TAH/BSO for :" abnormal cells" in 2011.   Statement of  Likelihood of Success: Excellent. TIME-OUT  observed.:  Procedure:  Cystoscopy was accomplished, showing normal-appearing bladder base and normal trigone. Clear reflux was seen from both ureteral orifices. Using the injection scope, 200 units of Botox are injected into the bladder submucosally, via 21 mL injections, including a 1 mL   "chaser" injection at the end. There was no bleeding noted. The patient tolerated procedure well. The bladder was drained of fluid, and the patient was awakened and taken to recovery in good condition. The patient's husband was notified of the patient's condition.

## 2016-03-27 NOTE — Anesthesia Procedure Notes (Signed)
Procedure Name: LMA Insertion Date/Time: 03/27/2016 11:51 AM Performed by: Norva PavlovALLAWAY, Ermal Brzozowski G Pre-anesthesia Checklist: Patient identified, Emergency Drugs available, Suction available and Patient being monitored Patient Re-evaluated:Patient Re-evaluated prior to inductionOxygen Delivery Method: Circle System Utilized Preoxygenation: Pre-oxygenation with 100% oxygen Intubation Type: IV induction Ventilation: Mask ventilation without difficulty LMA: LMA inserted LMA Size: 4.0 Number of attempts: 1 Airway Equipment and Method: bite block Placement Confirmation: positive ETCO2 Tube secured with: Tape Dental Injury: Teeth and Oropharynx as per pre-operative assessment

## 2016-03-27 NOTE — Discharge Instructions (Signed)
°  Post Anesthesia Home Care Instructions  Activity: Get plenty of rest for the remainder of the day. A responsible adult should stay with you for 24 hours following the procedure.  For the next 24 hours, DO NOT: -Drive a car -Advertising copywriterperate machinery -Drink alcoholic beverages -Take any medication unless instructed by your physician -Make any legal decisions or sign important papers.  Meals: Start with liquid foods such as gelatin or soup. Progress to regular foods as tolerated. Avoid greasy, spicy, heavy foods. If nausea and/or vomiting occur, drink only clear liquids until the nausea and/or vomiting subsides. Call your physician if vomiting continues.  Special Instructions/Symptoms: Your throat may feel dry or sore from the anesthesia or the breathing tube placed in your throat during surgery. If this causes discomfort, gargle with warm salt water. The discomfort should disappear within 24 hours.  If you had a scopolamine patch placed behind your ear for the management of post- operative nausea and/or vomiting:  1. The medication in the patch is effective for 72 hours, after which it should be removed.  Wrap patch in a tissue and discard in the trash. Wash hands thoroughly with soap and water. 2. You may remove the patch earlier than 72 hours if you experience unpleasant side effects which may include dry mouth, dizziness or visual disturbances. 3. Avoid touching the patch. Wash your hands with soap and water after contact with the patch.     Botulinum Toxin Bladder Injection, Care After Refer to this sheet in the next few weeks. These instructions provide you with information about caring for yourself after your procedure. Your health care provider may also give you more specific instructions. Your treatment has been planned according to current medical practices, but problems sometimes occur. Call your health care provider if you have any problems or questions after your procedure. WHAT TO  EXPECT AFTER THE PROCEDURE After your procedure, it is common to have:  Blood-tinged urine.  Burning or soreness when you pass urine. HOME CARE INSTRUCTIONS  Do not drive for 24 hours if you received a sedative.  Take over-the-counter and prescription medicines only as told by your health care provider.  If you were prescribed an antibiotic medicine, take it as told by your health care provider. Do not stop taking the antibiotic even if you start to feel better.  Drink enough fluid to keep your urine clear or pale yellow.  Return to your normal activities as told by your health care provider. Ask your health care provider what activities are safe for you.  Keep all follow-up visits as told by your health care provider. This is important. SEEK MEDICAL CARE IF:  You have a fever or chills.  You have blood-tinged urine for more than one day after your procedure.  You have worsening pain or burning when you pass urine.  You have pain or burning when passing urine for more than two days after your procedure.  You have trouble emptying your bladder. SEEK IMMEDIATE MEDICAL CARE IF:  You have bright red blood in your urine.  You are unable to pass urine.   This information is not intended to replace advice given to you by your health care provider. Make sure you discuss any questions you have with your health care provider.   Document Released: 08/04/2015 Document Reviewed: 05/12/2015 Elsevier Interactive Patient Education [employer]2016 Elsevier Inc.

## 2016-03-27 NOTE — Anesthesia Postprocedure Evaluation (Signed)
Anesthesia Post Note  Patient: Stephanie Erickson  Procedure(s) Performed: Procedure(s) (LRB): CYSTOSCOPY WITH INJECTION, BLADDER BOTOX INJECTION 200 UNITS (N/A)  Patient location during evaluation: PACU Anesthesia Type: General Level of consciousness: awake Pain management: pain level controlled Respiratory status: spontaneous breathing Cardiovascular status: stable Anesthetic complications: no    Last Vitals:  Filed Vitals:   03/27/16 1245 03/27/16 1300  BP: 97/55 91/55  Pulse: 69 72  Temp:    Resp: 13 14    Last Pain:  Filed Vitals:   03/27/16 1316  PainSc: 3                  EDWARDS,Shekela Goodridge

## 2016-03-27 NOTE — Interval H&P Note (Signed)
History and Physical Interval Note:  03/27/2016 11:34 AM  Stephanie BirksAshley Erickson  has presented today for surgery, with the diagnosis of OVERACTIVE BLADDER  The various methods of treatment have been discussed with the patient and family. After consideration of risks, benefits and other options for treatment, the patient has consented to  Procedure(s): CYSTOSCOPY WITH INJECTION, BLADDER BOTOX INJECTION 200 UNITS (N/A) as a surgical intervention .  The patient's history has been reviewed, patient examined, no change in status, stable for surgery.  I have reviewed the patient's chart and labs.  Questions were answered to the patient's satisfaction.     Shoichi Mielke I Shoaib Siefker  Goals: Severe OAB control excellent with 100 u botox, but did not last > 3-4 months Liklihood success: Great, but concern is for need for SIC Alternate rx: discussed with pt. Failed medical Rxs Disabililty: discussed SIC possibility.

## 2016-03-28 ENCOUNTER — Encounter (HOSPITAL_BASED_OUTPATIENT_CLINIC_OR_DEPARTMENT_OTHER): Payer: Self-pay | Admitting: Urology

## 2016-03-31 ENCOUNTER — Encounter (HOSPITAL_COMMUNITY): Payer: Self-pay | Admitting: *Deleted

## 2016-03-31 ENCOUNTER — Emergency Department (HOSPITAL_COMMUNITY)
Admission: EM | Admit: 2016-03-31 | Discharge: 2016-04-01 | Disposition: A | Discharge status: Home / Self Care | Payer: Medicaid Other | Attending: Emergency Medicine | Admitting: Emergency Medicine

## 2016-03-31 DIAGNOSIS — Z79899 Other long term (current) drug therapy: Secondary | ICD-10-CM | POA: Diagnosis not present

## 2016-03-31 DIAGNOSIS — Z87448 Personal history of other diseases of urinary system: Secondary | ICD-10-CM | POA: Diagnosis not present

## 2016-03-31 DIAGNOSIS — R339 Retention of urine, unspecified: Secondary | ICD-10-CM

## 2016-03-31 DIAGNOSIS — R1084 Generalized abdominal pain: Secondary | ICD-10-CM | POA: Diagnosis not present

## 2016-03-31 DIAGNOSIS — Z880 Allergy status to penicillin: Secondary | ICD-10-CM | POA: Diagnosis not present

## 2016-03-31 DIAGNOSIS — R6883 Chills (without fever): Secondary | ICD-10-CM | POA: Diagnosis not present

## 2016-03-31 DIAGNOSIS — R319 Hematuria, unspecified: Secondary | ICD-10-CM | POA: Diagnosis not present

## 2016-03-31 DIAGNOSIS — R112 Nausea with vomiting, unspecified: Secondary | ICD-10-CM | POA: Diagnosis not present

## 2016-03-31 LAB — CBC WITH DIFFERENTIAL/PLATELET
BASOS PCT: 0 %
Basophils Absolute: 0 10*3/uL (ref 0.0–0.1)
EOS PCT: 3 %
Eosinophils Absolute: 0.3 10*3/uL (ref 0.0–0.7)
HCT: 38.1 % (ref 36.0–46.0)
HEMOGLOBIN: 12.9 g/dL (ref 12.0–15.0)
Lymphocytes Relative: 42 %
Lymphs Abs: 4.3 10*3/uL — ABNORMAL HIGH (ref 0.7–4.0)
MCH: 30.1 pg (ref 26.0–34.0)
MCHC: 33.9 g/dL (ref 30.0–36.0)
MCV: 89 fL (ref 78.0–100.0)
Monocytes Absolute: 0.8 10*3/uL (ref 0.1–1.0)
Monocytes Relative: 8 %
NEUTROS PCT: 47 %
Neutro Abs: 4.9 10*3/uL (ref 1.7–7.7)
PLATELETS: 379 10*3/uL (ref 150–400)
RBC: 4.28 MIL/uL (ref 3.87–5.11)
RDW: 13.8 % (ref 11.5–15.5)
WBC: 10.3 10*3/uL (ref 4.0–10.5)

## 2016-03-31 MED ORDER — MORPHINE SULFATE (PF) 4 MG/ML IV SOLN
4.0000 mg | Freq: Once | INTRAVENOUS | Status: AC
  Administered 2016-04-01: 4 mg via INTRAVENOUS
  Filled 2016-03-31: qty 1

## 2016-03-31 NOTE — ED Provider Notes (Signed)
CSN: [id number]     Arrival date & time 03/31/16  1924 History  By signing my name below, I, Stephanie Erickson, attest that this documentation has been prepared under the direction and in the presence of Linwood Dibbles, MD. Electronically Signed: [name], ED Scribe. 03/31/2016. 11:35 PM.    Chief Complaint  Patient presents with  . Urinary Retention   The history is provided by the patient. No language interpreter was used.   HPI Comments: Stephanie Erickson is a 40 y.o. female with a hx of interstitial cystitis who presents to the Emergency Department complaining of hematuria onset 5 days ago. Pt explains that she had urinary retention today with abdominal pain. She reports associated vomiting and chills. She states that she was able to urinate last night. She explains that normally gets 100 cc botox injections in her bladder but received a 200 cc botox injection in bladder 5 days ago. No alleviating factors noted. Pt has not tried any medications PTA. She denies any fever.    Past Medical History  Diagnosis Date  . Frequency of urination   . Urgency of urination   . IC (interstitial cystitis)    Past Surgical History  Procedure Laterality Date  . Appendectomy  age 43  . Total abdominal hysterectomy w/ bilateral salpingoophorectomy  2011  . Cysto/  botox injection  x2  June 2016  . Cystoscopy N/A 10/29/2015    Procedure: CYSTOSCOPY WITH BOTOX  ;  Surgeon: Jethro Bolus, MD;  Location: [facility];  Service: Urology;  Laterality: N/A;  . Cystoscopy with injection N/A 03/27/2016    Procedure: CYSTOSCOPY WITH INJECTION, BLADDER BOTOX INJECTION 200 UNITS;  Surgeon: Jethro Bolus, MD;  Location: [facility];  Service: Urology;  Laterality: N/A;   No family history on file. Social History  Substance Use Topics  . Smoking status: Never Smoker   . Smokeless tobacco: Never Used  . Alcohol Use: No   OB History    No data available     Review of  Systems  Constitutional: Positive for chills. Negative for fever.  Gastrointestinal: Positive for nausea, vomiting and abdominal pain. Negative for diarrhea.  Genitourinary: Positive for hematuria.       Urinary retention      Allergies  Penicillins  Home Medications   Prior to Admission medications   Medication Sig Start Date End Date Taking? Authorizing Provider  ondansetron (ZOFRAN ODT) 4 MG disintegrating tablet 4mg  ODT q4 hours prn nausea/vomit 09/17/15   Robyn M Hess, PA-C  phentermine 37.5 MG capsule Take 37.5 mg by mouth every morning.    Historical Provider, MD  traMADol-acetaminophen (ULTRACET) 37.5-325 MG tablet Take 1 tablet by mouth every 6 (six) hours as needed for severe pain. 11/08/15   Jethro Bolus, MD  URELLE (URELLE/URISED) 81 MG TABS tablet Take 1 tablet (81 mg total) by mouth 3 (three) times daily. 10/29/15   Jethro Bolus, MD   BP 134/94 mmHg  Pulse 89  Temp(Src) 97.9 F (36.6 C) (Oral)  Resp 18  Ht 5\' 3"  (1.6 m)  Wt 78.926 kg  BMI 30.83 kg/m2  SpO2 98% Physical Exam  Constitutional: She appears well-developed and well-nourished. No distress.  HENT:  Head: Normocephalic and atraumatic.  Right Ear: External ear normal.  Left Ear: External ear normal.  Eyes: Conjunctivae are normal. Right eye exhibits no discharge. Left eye exhibits no discharge. No scleral icterus.  Neck: Neck supple. No tracheal deviation present.  Cardiovascular: Normal rate, regular rhythm  and intact distal pulses.   Pulmonary/Chest: Effort normal and breath sounds normal. No stridor. No respiratory distress. She has no wheezes. She has no rales.  Abdominal: Soft. Bowel sounds are normal. She exhibits no distension. There is generalized tenderness. There is guarding. There is no rigidity and no rebound. No hernia.  Questionable bladder distention on exam.   Musculoskeletal: She exhibits no edema or tenderness.  Neurological: She is alert. She has normal strength. No cranial  nerve deficit (no facial droop, extraocular movements intact, no slurred speech) or sensory deficit. She exhibits normal muscle tone. She displays no seizure activity. Coordination normal.  Skin: Skin is warm and dry. No rash noted.  Psychiatric: She has a normal mood and affect.  Nursing note and vitals reviewed.   ED Course  Procedures (including critical care time)  Medications  diatrizoate meglumine-sodium (GASTROGRAFIN) 66-10 % solution 15 mL (15 mLs Oral Given 04/01/16 0059)  morphine 4 MG/ML injection 4 mg (not administered)  morphine 4 MG/ML injection 4 mg (4 mg Intravenous Given 04/01/16 0038)  iopamidol (ISOVUE-300) 61 % injection 100 mL (100 mLs Intravenous Contrast Given 04/01/16 0116)    DIAGNOSTIC STUDIES: Oxygen Saturation is 98% on RA, normal by my interpretation.    COORDINATION OF CARE: 11:23 PM- Pt advised of plan for treatment and pt agrees. Pt will receive UA after foley catheter insertion for further evaluation.   1237  Foley catheter was placed.  500 cc urine in the catheter bag.  Pt states pressure is better but still having pain.   Labs Review Labs Reviewed  URINALYSIS, ROUTINE W REFLEX MICROSCOPIC (NOT AT [facility]) - Abnormal; Notable for the following:    Hgb urine dipstick TRACE (*)    All other components within normal limits  CBC WITH DIFFERENTIAL/PLATELET - Abnormal; Notable for the following:    Lymphs Abs 4.3 (*)    All other components within normal limits  BASIC METABOLIC PANEL - Abnormal; Notable for the following:    Glucose, Bld 103 (*)    All other components within normal limits  HEPATIC FUNCTION PANEL - Abnormal; Notable for the following:    Total Protein 8.3 (*)    Bilirubin, Direct <0.1 (*)    All other components within normal limits  URINE MICROSCOPIC-ADD ON - Abnormal; Notable for the following:    Squamous Epithelial / LPF 0-5 (*)    Bacteria, UA RARE (*)    All other components within normal limits  LIPASE, BLOOD    Imaging  Review Ct Abdomen Pelvis W Contrast  04/01/2016  CLINICAL DATA:  Initial evaluation for acute abdominal pain, hematuria. EXAM: CT ABDOMEN AND PELVIS WITH CONTRAST TECHNIQUE: Multidetector CT imaging of the abdomen and pelvis was performed using the standard protocol following bolus administration of intravenous contrast. CONTRAST:  ISOVUE-300 IOPAMIDOL (ISOVUE-300) INJECTION 61% COMPARISON:  Prior CT from 02/04/2016 FINDINGS: Minimal atelectatic changes noted within the medial segment of the right middle lobe and lingula. Mild atelectasis within the left lower lobe as well. Visualized lung bases are otherwise clear. Liver demonstrates a normal contrast enhanced appearance. Gallbladder within normal limits. No biliary dilatation. Spleen, adrenal glands, and pancreas demonstrate a normal contrast enhanced appearance. Kidneys are equal in size with symmetric enhancement. No nephrolithiasis or hydronephrosis. No focal enhancing renal mass. Stomach within normal limits. No evidence for bowel obstruction. No abnormal wall thickening, mucosal enhancement, or inflammatory fat stranding seen about the bowels. Appendix not visualize, consistent with history of prior appendectomy. Bladder decompressed with a  Foley catheter in place. Uterus is absent.  Ovaries not discretely identified. No free air or fluid. No pathologically enlarged intra-abdominal or pelvic lymph nodes identified. Normal intravascular enhancement seen throughout the intra-abdominal aorta and its branch vessels. No acute osseous abnormality. No worrisome lytic or blastic osseous lesions. IMPRESSION: 1. No CT evidence for acute intra-abdominal or pelvic process identified. 2. Decompression of the bladder with a Foley catheter in place. 3. Status post appendectomy and hysterectomy. Electronically Signed   By: Rise MuBenjamin  McClintock M.D.   On: 04/01/2016 01:37     Linwood DibblesJon Vaudie Engebretsen, MD has personally reviewed and evaluated these images and lab results as part of  his medical decision-making.    MDM   Final diagnoses:  Urinary retention   1239 Pt is complaining of persistent pain despite the foley catheter placement.  Tenderness is more diffuse.  Not clearly related to her bladder and interstitial cystitis.  Will ct abdomen to evaluate further.  0149  CT scan without acute findings.  Sx may be related to her interstitial cystitis.  No other acute findings noted on labs or CT  I personally performed the services described in this documentation, which was scribed in my presence.  The recorded information has been reviewed and is accurate.   Linwood DibblesJon Rubens Cranston, MD 04/01/16 [phone]

## 2016-03-31 NOTE — ED Notes (Signed)
Pt stated she is unable to urinate

## 2016-03-31 NOTE — ED Notes (Signed)
Pt states she had botox injections in her bladder on Monday by dr. Patsi Searstannenbaum for interstitial cystitis, states she usually only gets 100cc injections, but this time he gave her 200cc injections. Pt says she has not been able to urinate  much since getting the injections. Onset of vomiting 2 hours ago. Denies fevers, states "just chills"

## 2016-03-31 NOTE — ED Notes (Signed)
MD at bedside. 

## 2016-04-01 ENCOUNTER — Emergency Department (HOSPITAL_COMMUNITY): Payer: Medicaid Other

## 2016-04-01 LAB — HEPATIC FUNCTION PANEL
ALBUMIN: 4.7 g/dL (ref 3.5–5.0)
ALK PHOS: 72 U/L (ref 38–126)
ALT: 36 U/L (ref 14–54)
AST: 27 U/L (ref 15–41)
BILIRUBIN TOTAL: 0.7 mg/dL (ref 0.3–1.2)
Bilirubin, Direct: <0.1 mg/dL — ABNORMAL LOW (ref 0.1–0.5)
Total Protein: 8.3 g/dL — ABNORMAL HIGH (ref 6.5–8.1)

## 2016-04-01 LAB — URINALYSIS, ROUTINE W REFLEX MICROSCOPIC
BILIRUBIN URINE: NEGATIVE
Glucose, UA: NEGATIVE mg/dL
Ketones, ur: NEGATIVE mg/dL
Leukocytes, UA: NEGATIVE
NITRITE: NEGATIVE
PH: 6.5 (ref 5.0–8.0)
Protein, ur: NEGATIVE mg/dL
SPECIFIC GRAVITY, URINE: 1.011 (ref 1.005–1.030)

## 2016-04-01 LAB — URINE MICROSCOPIC-ADD ON

## 2016-04-01 LAB — LIPASE, BLOOD: Lipase: 23 U/L (ref 11–51)

## 2016-04-01 LAB — BASIC METABOLIC PANEL
ANION GAP: 10 (ref 5–15)
BUN: 17 mg/dL (ref 6–20)
CHLORIDE: 104 mmol/L (ref 101–111)
CO2: 25 mmol/L (ref 22–32)
CREATININE: 0.62 mg/dL (ref 0.44–1.00)
Calcium: 9.7 mg/dL (ref 8.9–10.3)
GFR calc non Af Amer: >60 mL/min (ref >60)
Glucose, Bld: 103 mg/dL — ABNORMAL HIGH (ref 65–99)
POTASSIUM: 3.5 mmol/L (ref 3.5–5.1)
Sodium: 139 mmol/L (ref 135–145)

## 2016-04-01 MED ORDER — DIATRIZOATE MEGLUMINE & SODIUM 66-10 % PO SOLN
15.0000 mL | ORAL | Status: DC | PRN
  Administered 2016-04-01: 15 mL via ORAL

## 2016-04-01 MED ORDER — IOPAMIDOL (ISOVUE-300) INJECTION 61%
100.0000 mL | Freq: Once | INTRAVENOUS | Status: AC | PRN
  Administered 2016-04-01: 100 mL via INTRAVENOUS

## 2016-04-01 MED ORDER — MORPHINE SULFATE (PF) 4 MG/ML IV SOLN
4.0000 mg | Freq: Once | INTRAVENOUS | Status: AC
  Administered 2016-04-01: 4 mg via INTRAVENOUS
  Filled 2016-04-01: qty 1

## 2016-04-01 NOTE — ED Notes (Signed)
Pt educated on foley care at home and verbalized understanding

## 2016-04-01 NOTE — ED Notes (Signed)
Pt transported to CT ?

## 2016-04-01 NOTE — Discharge Instructions (Signed)
Acute Urinary Retention, Female °Acute urinary retention is the temporary inability to urinate. This is an uncommon problem in women. It can be caused by: °· Infection. °· A side effect of a medicine. °· A problem in a nearby organ that presses or squeezes on the bladder or the urethra (the tube that drains the bladder). °· Psychological problems. °·  Surgery on your bladder, urethra, or pelvic organs that causes obstruction to the outflow of urine from your bladder. °HOME CARE INSTRUCTIONS  °If you are sent home with a Foley catheter and a drainage system, you will need to discuss the best course of action with your health care provider. While the catheter is in, maintain a good intake of fluids. Keep the drainage bag emptied and lower than your catheter. This is so that contaminated urine will not flow back into your bladder, which could lead to a urinary tract infection. °There are two main types of drainage bags. One is a large bag that usually is used at night. It has a good capacity that will allow you to sleep through the night without having to empty it. The second type is called a leg bag. It has a smaller capacity so it needs to be emptied more frequently. However, the main advantage is that it can be attached by a leg strap and goes underneath your clothing, allowing you the freedom to move about or leave your home. °Only take over-the-counter or prescription medicines for pain, discomfort, or fever as directed by your health care provider.  °SEEK MEDICAL CARE IF: °· You develop a low-grade fever. °· You experience spasms or leakage of urine with the spasms. °SEEK IMMEDIATE MEDICAL CARE IF:  °· You develop chills or fever. °· Your catheter stops draining urine. °· Your catheter falls out. °· You start to develop increased bleeding that does not respond to rest and increased fluid intake. °MAKE SURE YOU: °· Understand these instructions. °· Will watch your condition. °· Will get help right away if you are  not doing well or get worse. °  °This information is not intended to replace advice given to you by your health care provider. Make sure you discuss any questions you have with your health care provider. °  °Document Released: 11/12/2006 Document Revised: 03/30/2015 Document Reviewed: 04/24/2013 °Elsevier Interactive Patient Education ©2016 Elsevier Inc. ° °

## 2016-11-11 IMAGING — CR DG CHEST 2V
2 series · 2 of 2 positions shown · non-contrast
Comparison: 10/03/2015.

CLINICAL DATA: 39-year-old presenting with acute onset of chest
pain which began 10/03/2015.

EXAM:
CHEST  2 VIEW

[w chest pa]
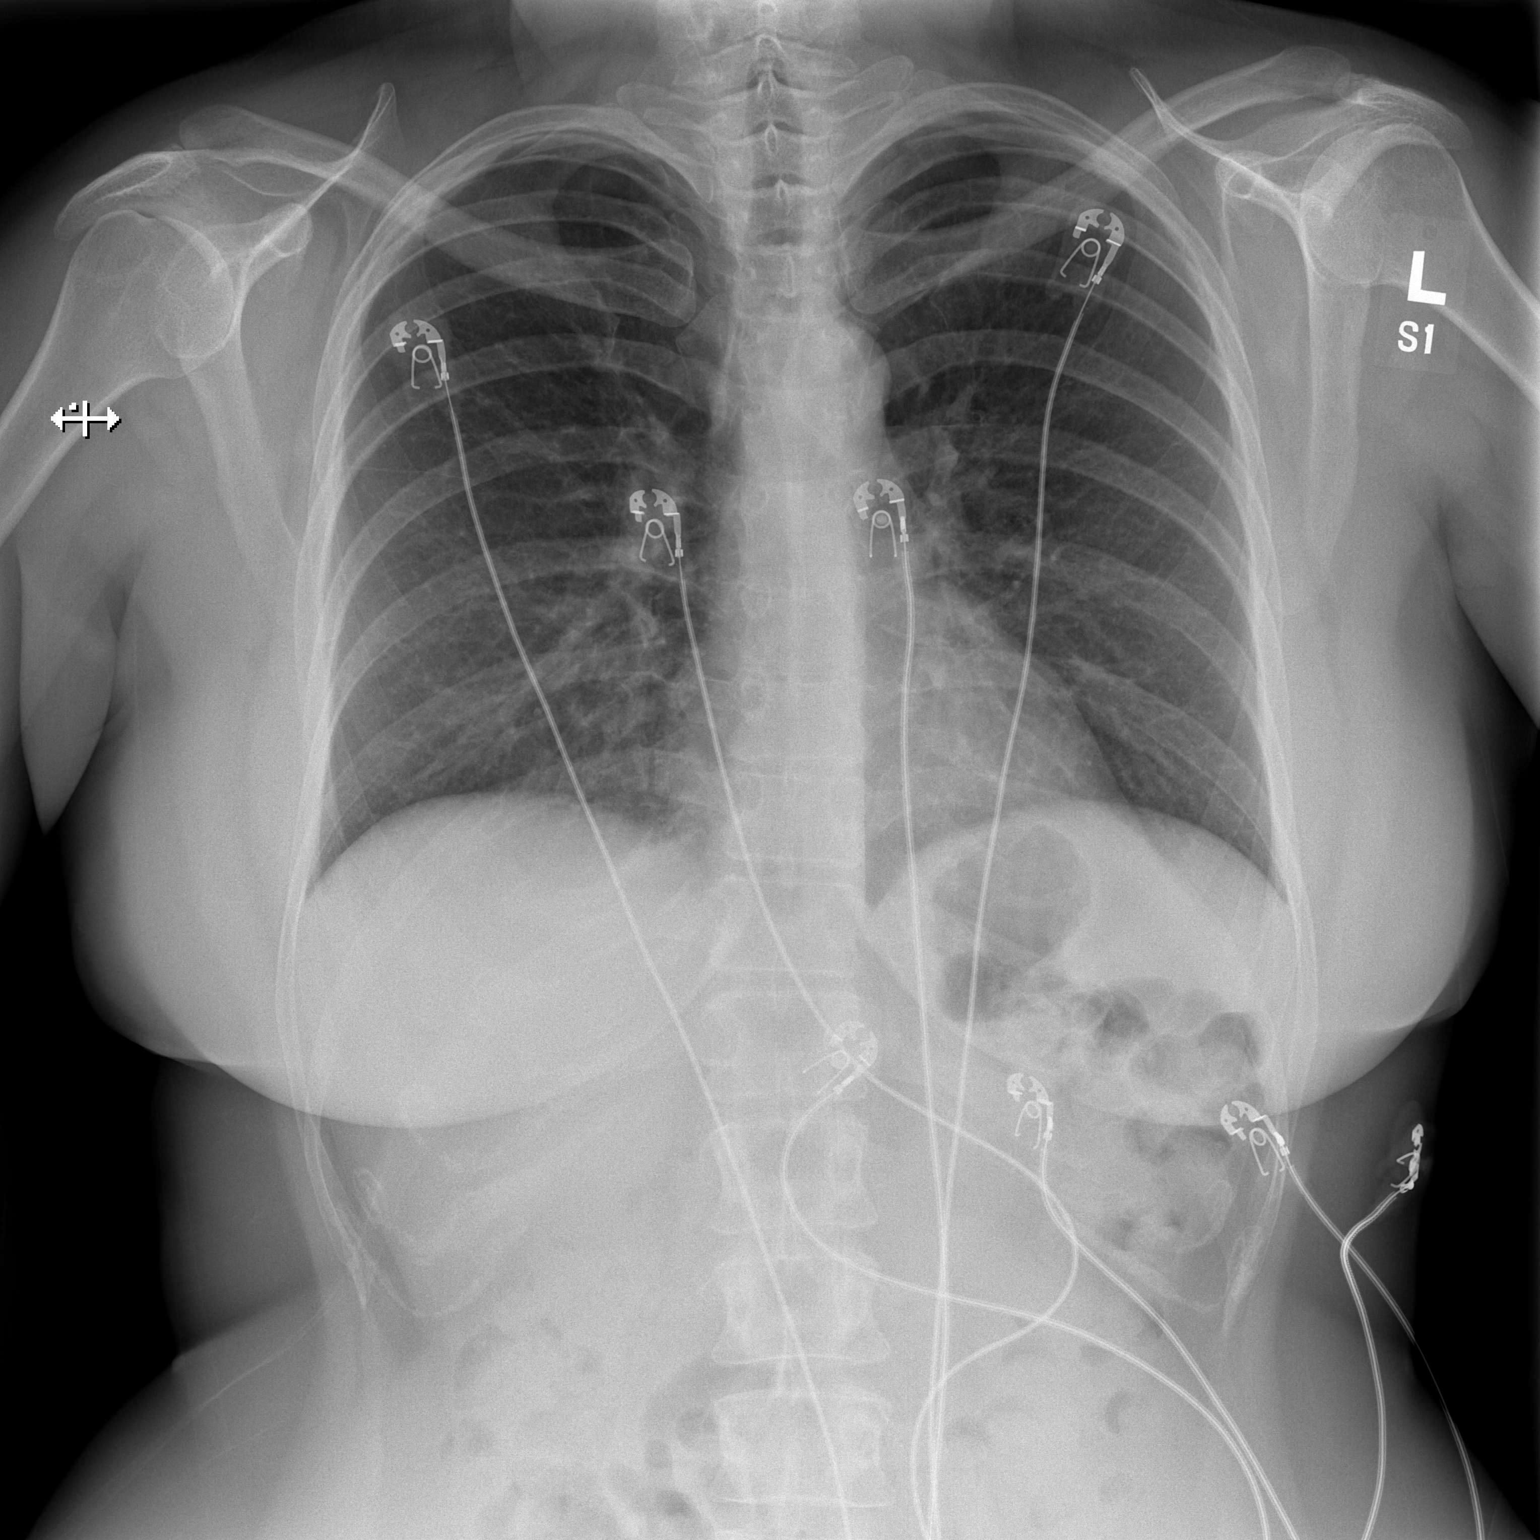

[w chest lat]
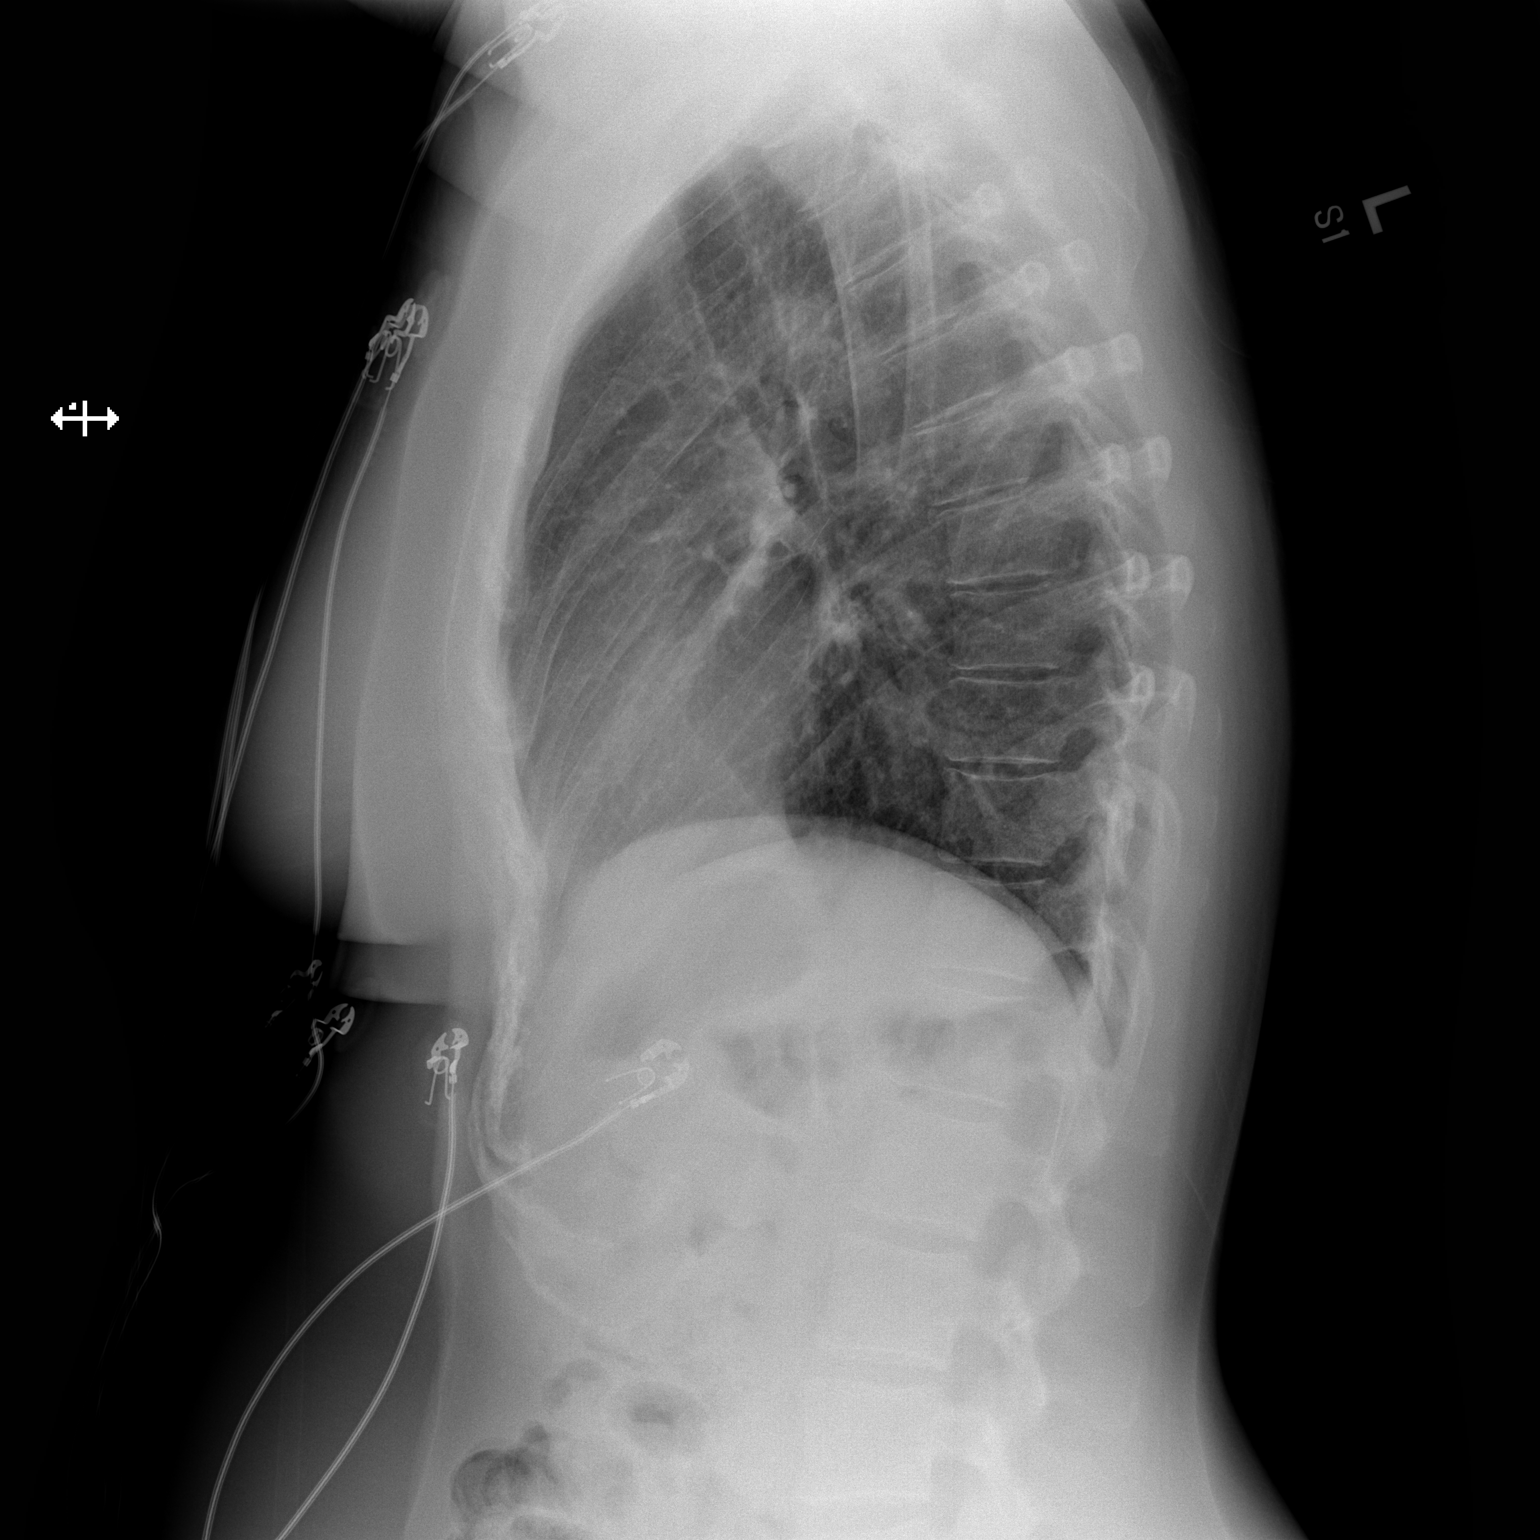

[2 of 2 positions shown; findings below may reference images not displayed]

FINDINGS: Cardiac silhouette normal in size, unchanged. Minimal
atherosclerosis involving the aortic arch. Hilar and mediastinal
contours otherwise unremarkable. Lungs clear. Bronchovascular
markings normal. Pulmonary vascularity normal. No visible pleural
effusions. No pneumothorax. Visualized bony thorax intact.
IMPRESSION: No acute cardiopulmonary disease.

## 2016-12-09 IMAGING — CT CT ABD-PELV W/ CM
2 of 6 series · 15 of 46 positions shown, 17 images · IV contrast (ISOVUE)
Comparison: Prior CT from 02/04/2016

CLINICAL DATA: Initial evaluation for acute abdominal pain,
hematuria.

EXAM:
CT ABDOMEN AND PELVIS WITH CONTRAST
TECHNIQUE: Multidetector CT imaging of the abdomen and pelvis was performed
using the standard protocol following bolus administration of
intravenous contrast.
CONTRAST:  100mL [id number] IOPAMIDOL ([id number]) INJECTION 61%

[Series 2: abd/pel with · axial · 0.76mm/px · z∈[-490,-124]mm · 12 of 87 slices shown, 14 images]
[im 7/87  soft-tissue]
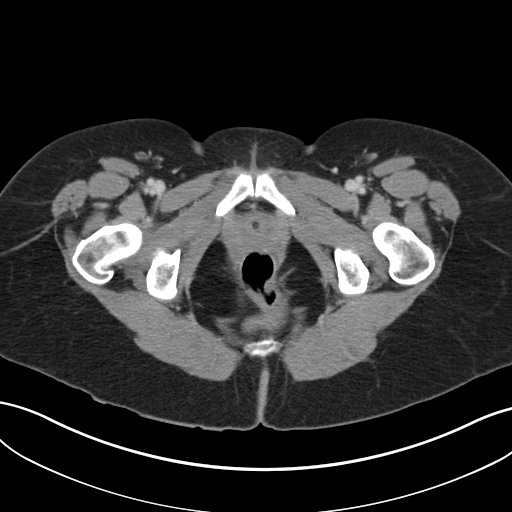
[im 7/87  bone]
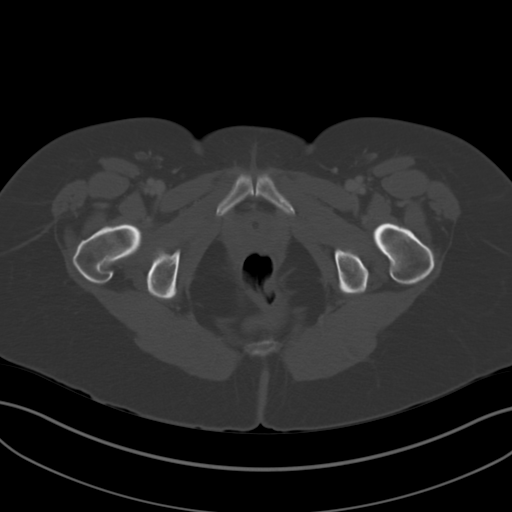
[im 14/87  soft-tissue]
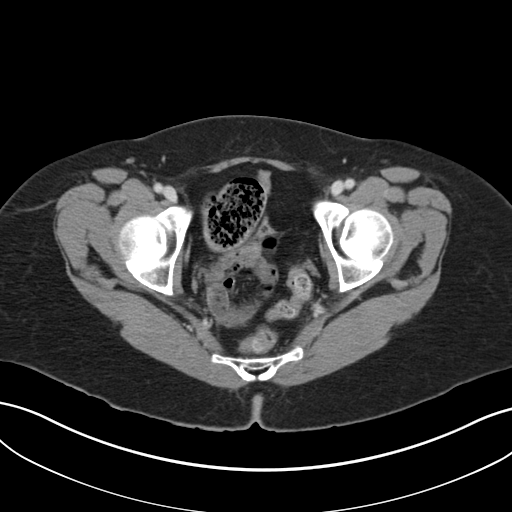
[im 20/87  soft-tissue]
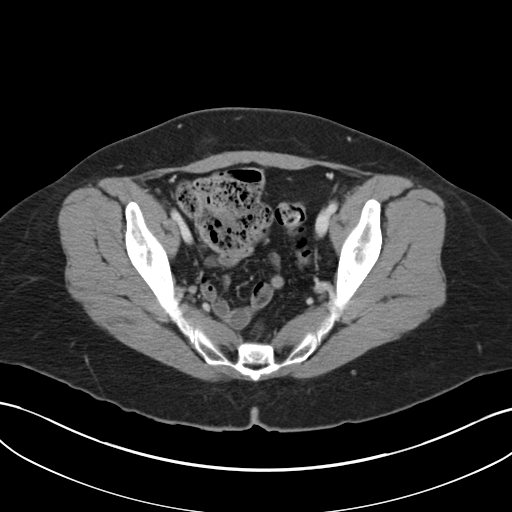
[im 27/87  soft-tissue]
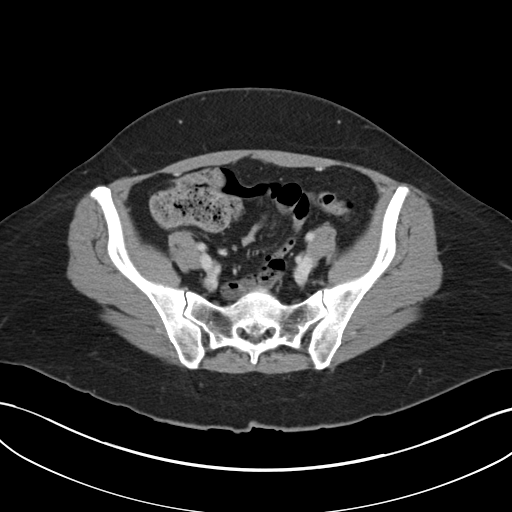
[im 34/87  soft-tissue]
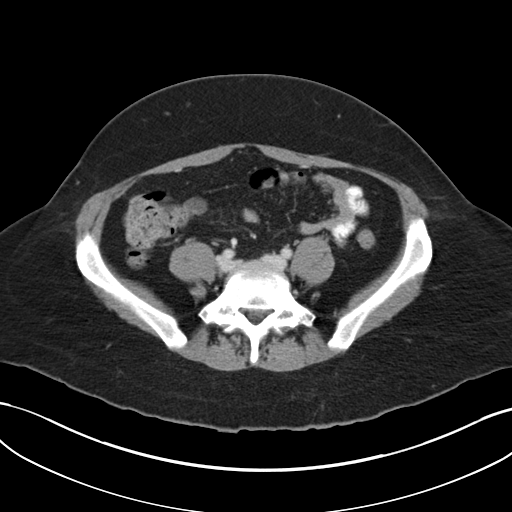
[im 40/87  soft-tissue]
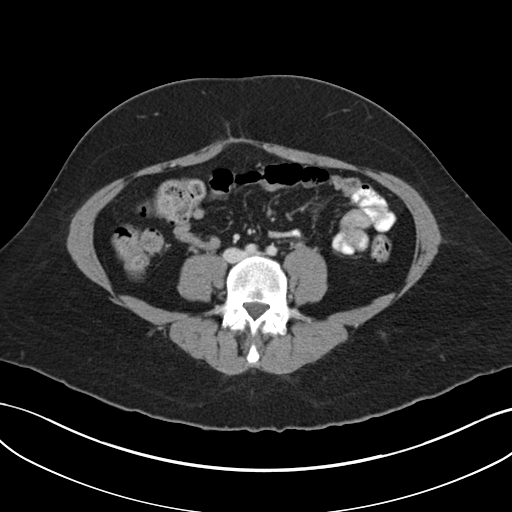
[im 47/87  soft-tissue]
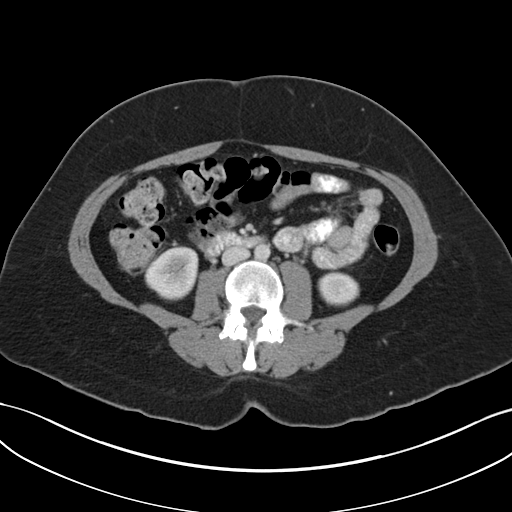
[im 53/87  soft-tissue]
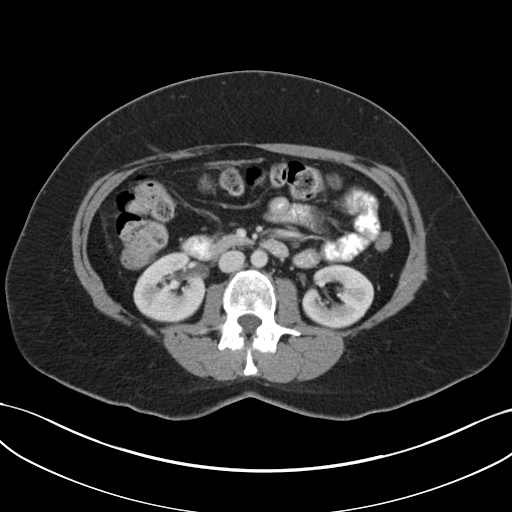
[im 60/87  soft-tissue]
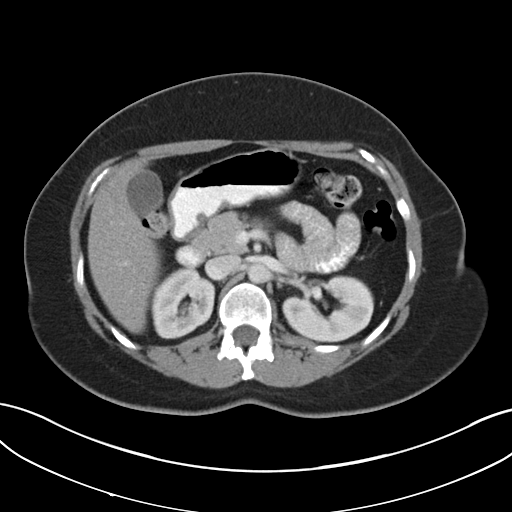
[im 60/87  bone]
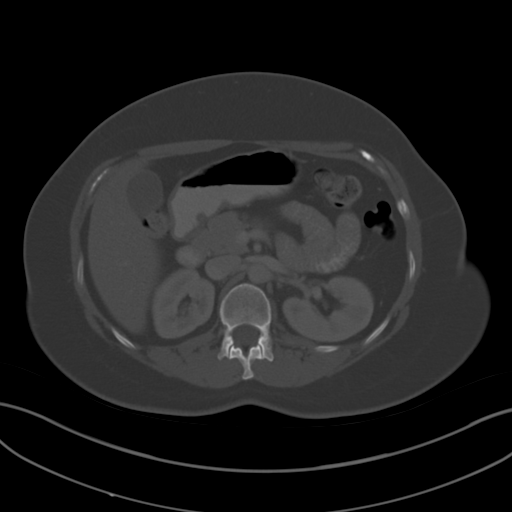
[im 67/87  soft-tissue]
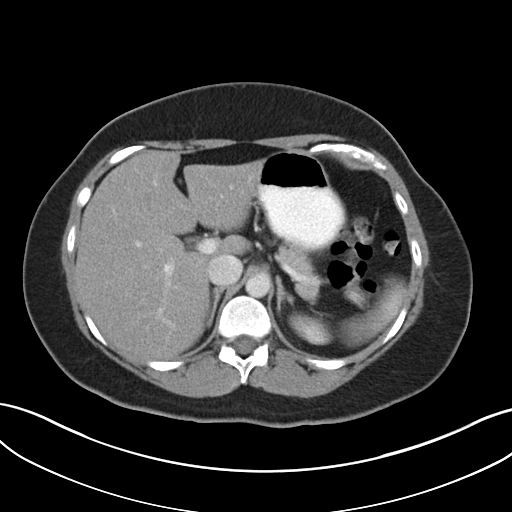
[im 73/87  soft-tissue]
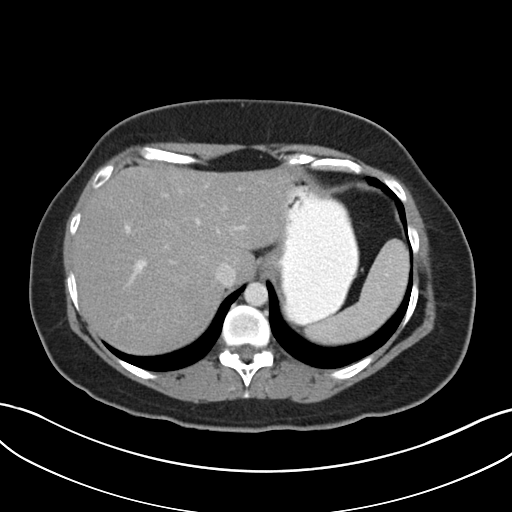
[im 80/87  soft-tissue]
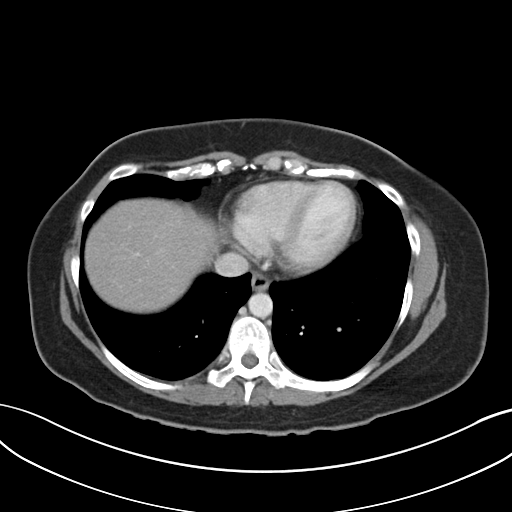

[Series 5: coronal a/|p · coronal · 0.72mm/px · 3 of 134 slices shown]
[im 45/134  soft-tissue]
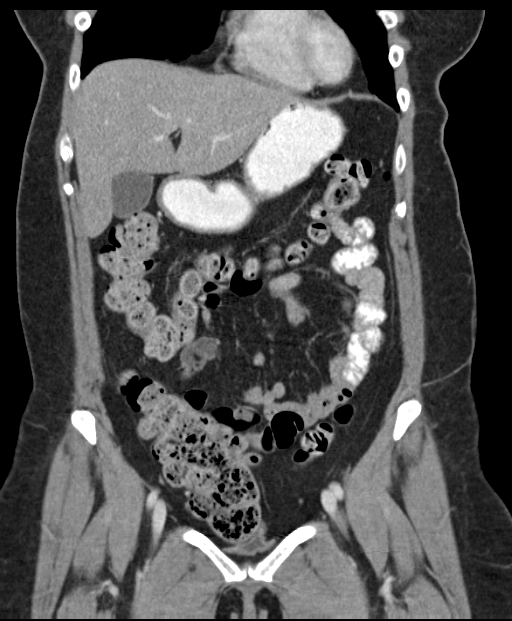
[im 60/134  soft-tissue]
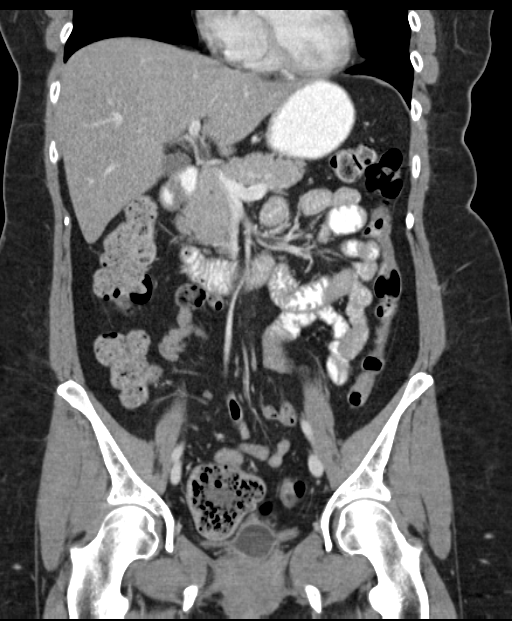
[im 74/134  soft-tissue]
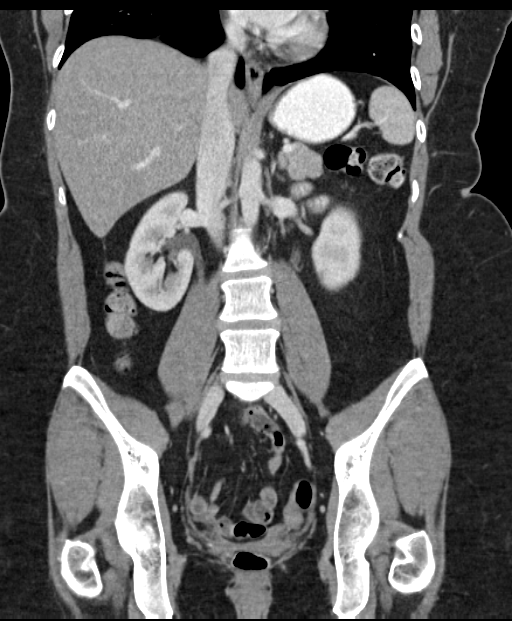

[15 of 46 positions shown; findings below may reference images not displayed]

FINDINGS: Minimal atelectatic changes noted within the medial segment of the
right middle lobe and lingula. Mild atelectasis within the left
lower lobe as well. Visualized lung bases are otherwise clear.

Liver demonstrates a normal contrast enhanced appearance.
Gallbladder within normal limits. No biliary dilatation. Spleen,
adrenal glands, and pancreas demonstrate a normal contrast enhanced
appearance.

Kidneys are equal in size with symmetric enhancement. No
nephrolithiasis or hydronephrosis. No focal enhancing renal mass.

Stomach within normal limits. No evidence for bowel obstruction. No
abnormal wall thickening, mucosal enhancement, or inflammatory fat
stranding seen about the bowels. Appendix not visualize, consistent
with history of prior appendectomy.

Bladder decompressed with a Foley catheter in place.

Uterus is absent.  Ovaries not discretely identified.

No free air or fluid. No pathologically enlarged intra-abdominal or
pelvic lymph nodes identified. Normal intravascular enhancement seen
throughout the intra-abdominal aorta and its branch vessels.

No acute osseous abnormality. No worrisome lytic or blastic osseous
lesions.
IMPRESSION: 1. No CT evidence for acute intra-abdominal or pelvic process
identified.
2. Decompression of the bladder with a Foley catheter in place.
3. Status post appendectomy and hysterectomy.
